# Patient Record
Sex: Female | Born: 1935 | Race: White | Hispanic: No | Smoking: Never smoker
Health system: Southern US, Community
[De-identification: ages and names within clinical notes are randomized; demographics above are authoritative.]

## PROBLEM LIST (undated history)

## (undated) DIAGNOSIS — I4891 Unspecified atrial fibrillation: Secondary | ICD-10-CM

## (undated) DIAGNOSIS — E039 Hypothyroidism, unspecified: Secondary | ICD-10-CM

## (undated) DIAGNOSIS — E785 Hyperlipidemia, unspecified: Secondary | ICD-10-CM

## (undated) DIAGNOSIS — I639 Cerebral infarction, unspecified: Secondary | ICD-10-CM

## (undated) DIAGNOSIS — I10 Essential (primary) hypertension: Secondary | ICD-10-CM

## (undated) DIAGNOSIS — F0280 Dementia in other diseases classified elsewhere without behavioral disturbance: Secondary | ICD-10-CM

## (undated) DIAGNOSIS — I69354 Hemiplegia and hemiparesis following cerebral infarction affecting left non-dominant side: Secondary | ICD-10-CM

## (undated) DIAGNOSIS — G300 Alzheimer's disease with early onset: Secondary | ICD-10-CM

## (undated) DIAGNOSIS — R54 Age-related physical debility: Secondary | ICD-10-CM

## (undated) DIAGNOSIS — E0590 Thyrotoxicosis, unspecified without thyrotoxic crisis or storm: Secondary | ICD-10-CM

## (undated) HISTORY — DX: Cerebral infarction, unspecified: I63.9

---

## 2015-10-16 ENCOUNTER — Emergency Department (HOSPITAL_COMMUNITY): Payer: Medicare Other

## 2015-10-16 ENCOUNTER — Inpatient Hospital Stay (HOSPITAL_COMMUNITY)
Admission: EM | Admit: 2015-10-16 | Discharge: 2015-10-20 | DRG: 065 | Disposition: A | Discharge status: Skilled Nursing Facility | Payer: Medicare Other | Attending: Internal Medicine | Admitting: Internal Medicine

## 2015-10-16 ENCOUNTER — Encounter (HOSPITAL_COMMUNITY): Payer: Self-pay

## 2015-10-16 DIAGNOSIS — I10 Essential (primary) hypertension: Secondary | ICD-10-CM | POA: Diagnosis present

## 2015-10-16 DIAGNOSIS — E0590 Thyrotoxicosis, unspecified without thyrotoxic crisis or storm: Secondary | ICD-10-CM | POA: Diagnosis present

## 2015-10-16 DIAGNOSIS — I639 Cerebral infarction, unspecified: Secondary | ICD-10-CM

## 2015-10-16 DIAGNOSIS — E785 Hyperlipidemia, unspecified: Secondary | ICD-10-CM | POA: Diagnosis present

## 2015-10-16 DIAGNOSIS — Z66 Do not resuscitate: Secondary | ICD-10-CM | POA: Diagnosis present

## 2015-10-16 DIAGNOSIS — R471 Dysarthria and anarthria: Secondary | ICD-10-CM | POA: Diagnosis present

## 2015-10-16 DIAGNOSIS — F0390 Unspecified dementia without behavioral disturbance: Secondary | ICD-10-CM | POA: Diagnosis present

## 2015-10-16 DIAGNOSIS — R29810 Facial weakness: Secondary | ICD-10-CM | POA: Diagnosis present

## 2015-10-16 DIAGNOSIS — I6789 Other cerebrovascular disease: Secondary | ICD-10-CM | POA: Diagnosis not present

## 2015-10-16 DIAGNOSIS — Z79899 Other long term (current) drug therapy: Secondary | ICD-10-CM | POA: Diagnosis not present

## 2015-10-16 DIAGNOSIS — I63511 Cerebral infarction due to unspecified occlusion or stenosis of right middle cerebral artery: Secondary | ICD-10-CM

## 2015-10-16 DIAGNOSIS — G8194 Hemiplegia, unspecified affecting left nondominant side: Secondary | ICD-10-CM | POA: Diagnosis present

## 2015-10-16 DIAGNOSIS — Z885 Allergy status to narcotic agent status: Secondary | ICD-10-CM | POA: Diagnosis not present

## 2015-10-16 DIAGNOSIS — I481 Persistent atrial fibrillation: Secondary | ICD-10-CM | POA: Diagnosis not present

## 2015-10-16 DIAGNOSIS — E039 Hypothyroidism, unspecified: Secondary | ICD-10-CM | POA: Diagnosis present

## 2015-10-16 DIAGNOSIS — I4891 Unspecified atrial fibrillation: Secondary | ICD-10-CM | POA: Diagnosis present

## 2015-10-16 DIAGNOSIS — Z888 Allergy status to other drugs, medicaments and biological substances status: Secondary | ICD-10-CM

## 2015-10-16 DIAGNOSIS — F0391 Unspecified dementia with behavioral disturbance: Secondary | ICD-10-CM | POA: Insufficient documentation

## 2015-10-16 DIAGNOSIS — I4819 Other persistent atrial fibrillation: Secondary | ICD-10-CM

## 2015-10-16 DIAGNOSIS — I63411 Cerebral infarction due to embolism of right middle cerebral artery: Principal | ICD-10-CM | POA: Diagnosis present

## 2015-10-16 DIAGNOSIS — F03918 Unspecified dementia, unspecified severity, with other behavioral disturbance: Secondary | ICD-10-CM | POA: Insufficient documentation

## 2015-10-16 DIAGNOSIS — R1310 Dysphagia, unspecified: Secondary | ICD-10-CM | POA: Diagnosis present

## 2015-10-16 HISTORY — DX: Thyrotoxicosis, unspecified without thyrotoxic crisis or storm: E05.90

## 2015-10-16 LAB — PROTIME-INR
INR: 1.14 (ref 0.00–1.49)
PROTHROMBIN TIME: 14.7 s (ref 11.6–15.2)

## 2015-10-16 LAB — I-STAT CHEM 8, ED
BUN: 27 mg/dL — ABNORMAL HIGH (ref 6–20)
CHLORIDE: 105 mmol/L (ref 101–111)
Calcium, Ion: 1.12 mmol/L — ABNORMAL LOW (ref 1.13–1.30)
Creatinine, Ser: 0.8 mg/dL (ref 0.44–1.00)
GLUCOSE: 122 mg/dL — AB (ref 65–99)
HEMATOCRIT: 45 % (ref 36.0–46.0)
HEMOGLOBIN: 15.3 g/dL — AB (ref 12.0–15.0)
POTASSIUM: 4.1 mmol/L (ref 3.5–5.1)
SODIUM: 140 mmol/L (ref 135–145)
TCO2: 23 mmol/L (ref 0–100)

## 2015-10-16 LAB — COMPREHENSIVE METABOLIC PANEL
ALT: 20 U/L (ref 14–54)
AST: 27 U/L (ref 15–41)
Albumin: 3.6 g/dL (ref 3.5–5.0)
Alkaline Phosphatase: 75 U/L (ref 38–126)
Anion gap: 11 (ref 5–15)
BUN: 25 mg/dL — ABNORMAL HIGH (ref 6–20)
CHLORIDE: 106 mmol/L (ref 101–111)
CO2: 23 mmol/L (ref 22–32)
Calcium: 9.6 mg/dL (ref 8.9–10.3)
Creatinine, Ser: 0.91 mg/dL (ref 0.44–1.00)
GFR, EST AFRICAN AMERICAN: 57 mL/min — AB (ref >60)
GFR, EST NON AFRICAN AMERICAN: 49 mL/min — AB (ref >60)
Glucose, Bld: 125 mg/dL — ABNORMAL HIGH (ref 65–99)
POTASSIUM: 4.3 mmol/L (ref 3.5–5.1)
SODIUM: 140 mmol/L (ref 135–145)
Total Bilirubin: 1.1 mg/dL (ref 0.3–1.2)
Total Protein: 6.4 g/dL — ABNORMAL LOW (ref 6.5–8.1)

## 2015-10-16 LAB — DIFFERENTIAL
BASOS ABS: 0 10*3/uL (ref 0.0–0.1)
BASOS PCT: 0 %
EOS ABS: 0.1 10*3/uL (ref 0.0–0.7)
Eosinophils Relative: 2 %
Lymphocytes Relative: 35 %
Lymphs Abs: 2.3 10*3/uL (ref 0.7–4.0)
MONO ABS: 0.4 10*3/uL (ref 0.1–1.0)
MONOS PCT: 5 %
Neutro Abs: 3.8 10*3/uL (ref 1.7–7.7)
Neutrophils Relative %: 58 %

## 2015-10-16 LAB — CBG MONITORING, ED: GLUCOSE-CAPILLARY: 104 mg/dL — AB (ref 65–99)

## 2015-10-16 LAB — URINE MICROSCOPIC-ADD ON

## 2015-10-16 LAB — CBC
HEMATOCRIT: 41 % (ref 36.0–46.0)
Hemoglobin: 13.8 g/dL (ref 12.0–15.0)
MCH: 31.4 pg (ref 26.0–34.0)
MCHC: 33.7 g/dL (ref 30.0–36.0)
MCV: 93.4 fL (ref 78.0–100.0)
PLATELETS: 161 10*3/uL (ref 150–400)
RBC: 4.39 MIL/uL (ref 3.87–5.11)
RDW: 14.4 % (ref 11.5–15.5)
WBC: 6.6 10*3/uL (ref 4.0–10.5)

## 2015-10-16 LAB — URINALYSIS, ROUTINE W REFLEX MICROSCOPIC
GLUCOSE, UA: NEGATIVE mg/dL
Ketones, ur: 15 mg/dL — AB
Leukocytes, UA: NEGATIVE
Nitrite: NEGATIVE
PH: 5 (ref 5.0–8.0)
Protein, ur: NEGATIVE mg/dL
SPECIFIC GRAVITY, URINE: 1.028 (ref 1.005–1.030)

## 2015-10-16 LAB — RAPID URINE DRUG SCREEN, HOSP PERFORMED
Amphetamines: NOT DETECTED
BENZODIAZEPINES: NOT DETECTED
Barbiturates: NOT DETECTED
COCAINE: NOT DETECTED
Opiates: NOT DETECTED
Tetrahydrocannabinol: NOT DETECTED

## 2015-10-16 LAB — APTT: APTT: 29 s (ref 24–37)

## 2015-10-16 LAB — ETHANOL

## 2015-10-16 LAB — I-STAT TROPONIN, ED: TROPONIN I, POC: 0.01 ng/mL (ref 0.00–0.08)

## 2015-10-16 NOTE — ED Provider Notes (Signed)
CSN: [id number]     Arrival date & time 10/16/15  1934 History   First MD Initiated Contact with Patient 10/16/15 2002     Chief Complaint  Patient presents with  . Code Stroke    @EDPCLEARED @ (Consider location/radiation/quality/duration/timing/severity/associated sxs/prior Treatment) HPI   Jordan Ayala is a 81 y.o. female who presents for evaluation of acute weakness and slurred speech. Nursing home attendants report that was last seen normal at 1840 today. Just prior to admission she was found altered and weak. She was transferred by EMS for evaluation as a code stroke patient.  Level V caveat-altered mental status    Past Medical History  Diagnosis Date  . Hyperthyroidism   . Hyperthyroidism    History reviewed. No pertinent past surgical history. No family history on file. Social History  Substance Use Topics  . Smoking status: Unknown If Ever Smoked  . Smokeless tobacco: None  . Alcohol Use: No   OB History    No data available     Review of Systems  Unable to perform ROS: Mental status change      Allergies  Codeine; Demerol; and Vioxx  Home Medications   Prior to Admission medications   Medication Sig Start Date End Date Taking? Authorizing Provider  acetaminophen (TYLENOL) 500 MG tablet Take 500 mg by mouth 2 (two) times daily.   Yes Historical Provider, MD  levothyroxine (SYNTHROID, LEVOTHROID) 25 MCG tablet Take 12.5 mcg by mouth daily before breakfast.   Yes Historical Provider, MD  levothyroxine (SYNTHROID, LEVOTHROID) 50 MCG tablet Take 50 mcg by mouth daily before breakfast.   Yes Historical Provider, MD   BP 186/71 mmHg  Pulse 48  Temp(Src) 97.6 F (36.4 C)  Resp 18  Ht 5\' 4"  (1.626 m)  Wt 140 lb (63.504 kg)  BMI 24.02 kg/m2  SpO2 97% Physical Exam  Constitutional: She appears well-developed.  Frail, elderly  HENT:  Head: Normocephalic and atraumatic.  Right Ear: External ear normal.  Left Ear: External ear normal.  Eyes:  Conjunctivae and EOM are normal. Pupils are equal, round, and reactive to light.  Neck: Normal range of motion and phonation normal. Neck supple.  Cardiovascular: Normal rate, regular rhythm and normal heart sounds.   Pulmonary/Chest: Effort normal and breath sounds normal. She exhibits no bony tenderness.  Abdominal: Soft. There is no tenderness.  Musculoskeletal: She exhibits edema (bilateral legs).  Neurological: She is alert. No cranial nerve deficit or sensory deficit. She exhibits normal muscle tone. Coordination normal.  She is dysarthric. Left-sided neglect is present. Flaccid paralysis, left arms and legs and left facial droop.  Skin: Skin is warm, dry and intact.  Psychiatric: She has a normal mood and affect. Her behavior is normal.  Nursing note and vitals reviewed.   ED Course  Procedures (including critical care time)  The patient was seen on arrival by the stroke neurologist, who decided to not give her TPA.   Medications - No data to display  Patient Vitals for the past 24 hrs:  BP Temp Pulse Resp SpO2 Height Weight  10/16/15 2022 - 97.6 F (36.4 C) - - - - -  10/16/15 1953 186/71 mmHg - - - - - -  10/16/15 1951 - - (!) 48 18 97 % 5\' 4"  (1.626 m) 140 lb (63.504 kg)    9:41 PM Reevaluation with update and discussion. After initial assessment and treatment, an updated evaluation reveals no change in clinical status. No family members have shown up. Apparently the  neurologist attempted to contact them earlier and they were not available. Breylen Agyeman L   Patient's niece and her husband arrived. They were updated on the findings. They called some other family members, and  had family meeting with her. Family members informed me that she was a DO NOT RESUSCITATE.   Labs Review Labs Reviewed  COMPREHENSIVE METABOLIC PANEL - Abnormal; Notable for the following:    Glucose, Bld 125 (*)    BUN 25 (*)    Total Protein 6.4 (*)    GFR calc non Af Amer 49 (*)    GFR calc  Af Amer 57 (*)    All other components within normal limits  URINALYSIS, ROUTINE W REFLEX MICROSCOPIC (NOT AT [facility]) - Abnormal; Notable for the following:    Color, Urine AMBER (*)    Hgb urine dipstick TRACE (*)    Bilirubin Urine SMALL (*)    Ketones, ur 15 (*)    All other components within normal limits  URINE MICROSCOPIC-ADD ON - Abnormal; Notable for the following:    Squamous Epithelial / LPF 0-5 (*)    Bacteria, UA RARE (*)    Casts HYALINE CASTS (*)    Crystals CA OXALATE CRYSTALS (*)    All other components within normal limits  I-STAT CHEM 8, ED - Abnormal; Notable for the following:    BUN 27 (*)    Glucose, Bld 122 (*)    Calcium, Ion 1.12 (*)    Hemoglobin 15.3 (*)    All other components within normal limits  CBG MONITORING, ED - Abnormal; Notable for the following:    Glucose-Capillary 104 (*)    All other components within normal limits  ETHANOL  PROTIME-INR  APTT  CBC  DIFFERENTIAL  URINE RAPID DRUG SCREEN, HOSP PERFORMED  I-STAT TROPOININ, ED    Imaging Review Ct Head Wo Contrast  10/16/2015  CLINICAL DATA:  81 year old female with code stroke. Left facial droop and left-sided weakness. EXAM: CT HEAD WITHOUT CONTRAST TECHNIQUE: Contiguous axial images were obtained from the base of the skull through the vertex without intravenous contrast. COMPARISON:  None. FINDINGS: The ventricles are dilated and the sulci are prominent compatible with age-related atrophy. Periventricular and deep white matter hypodensities represent chronic microvascular ischemic changes. There is no intracranial hemorrhage. No mass effect or midline shift identified. The visualized paranasal sinuses and mastoid air cells are well aerated. The calvarium is intact. IMPRESSION: No acute intracranial hemorrhage. Age-related atrophy and chronic microvascular ischemic disease. If symptoms persist and there are no contraindications, MRI may provide better evaluation if clinically indicated. These  results were called by telephone at the time of interpretation on 10/16/2015 at 7:53 pm to Dr. Hosie Poisson, who verbally acknowledged these results. Electronically Signed   By: Elgie Collard M.D.   On: 10/16/2015 19:55   I have personally reviewed and evaluated these images and lab results as part of my medical decision-making.   EKG Interpretation   Date/Time:  Friday October 16 2015 19:55:22 EST Ventricular Rate:  44 PR Interval:    QRS Duration: 81 QT Interval:  470 QTC Calculation: 402 R Axis:   62 Text Interpretation:  Atrial fibrillation Low voltage, precordial leads  Probable anteroseptal infarct, old Nonspecific T abnormalities, inferior  leads No old tracing to compare Confirmed by [facility]  MD, Delphina Schum [phone]) on  10/16/2015 9:51:21 PM      MDM   Final diagnoses:  Acute right MCA stroke (HCC)  Atrial fibrillation, unspecified type (HCC)  Nursing Notes Reviewed/ Care Coordinated, and agree without changes. Applicable Imaging Reviewed.  Interpretation of Laboratory Data incorporated into ED treatment  Plan: Admit    Mancel Bale, MD 10/16/15 2342

## 2015-10-16 NOTE — ED Notes (Signed)
[facility], pt LSN at 1640 walking around room and then sat down in recliner, pt was found by staff with L sided facial droop and and flaccid L side, and slurred speech. Disoriented X 3

## 2015-10-16 NOTE — Consult Note (Addendum)
Stroke Consult Consulting Physician: Dr Micah Flesher ED  Chief Complaint: altered mental status, left sided weakness  HPI: Jordan Ayala is an 81 y.o. female hx of baseline dementia, currently residing in a NH brought in after being found slumped over with difficulty talking and apparent left-sided weakness. Discussed with NH staff, LSW is unclear. Patient was seen standing by her table around 1800 but no one communicated with her at that time so it is unclear if she was already having speech/mental status changes at that time. Found around 1900 slumped over in her chair not moving her left-side.   CT head imaging reviewed, shows generalized atrophy, no acute process. Initial NIHSS of 23.  Date last known well: 10/16/2015 Time last known well: unclear  tPA Given: no, unclear LSW Modified Rankin: Rankin Score=3  Past Medical History  Diagnosis Date  . Hyperthyroidism   . Hyperthyroidism     History reviewed. No pertinent past surgical history.  No family history on file. Social History:  reports that she does not drink alcohol. Her tobacco and drug histories are not on file.  Allergies: No Known Allergies   (Not in a hospital admission)  ROS: Out of a complete 14 system review, the patient complains of only the following symptoms, and all other reviewed systems are negative. +weakness, confusion  Physical Examination: Filed Vitals:   10/16/15 1951 10/16/15 1953  BP:  186/71  Pulse: 48   Resp: 18    Physical Exam  Constitutional: He appears well-developed and well-nourished.  Psych: Affect appropriate to situation Eyes: No scleral injection HENT: No OP obstrucion Head: Normocephalic.  Cardiovascular: Normal rate and regular rhythm.  Respiratory: Effort normal and breath sounds normal.  GI: Soft. Bowel sounds are normal. No distension. There is no tenderness.  Skin: WDI  Neurologic Examination: Mental Status: Alert, oriented x 0. Limited verbal output, unable to  name items. Moderate dysarthria noted. Intermittently follows simple commands.  Cranial Nerves: II: unable to visualize optic discs, decreased blink to threat on the left, pupils equal, round, reactive to light III,IV, VI: ptosis not present, right gaze preference, cannot get eyes past midline CN: V, VII: left sided facial droop noted, unable to test facial sensation VIII: hearing normal bilaterally IX,X: gag reflex present XI: trapezius strength/neck flexion strength normal bilaterally XII: tongue strength normal  Motor: Unable to formally test due to mental status Lifts RUE and RLE against gravity and light resistance Unable to lift LUE or LLE against gravity Tone and bulk:normal tone throughout; no atrophy noted Sensory: withdrawals on right side, grimaces on left side Deep Tendon Reflexes: 2+ and symmetric throughout Plantars: Right: downgoing   Left: downgoing Cerebellar: Unable to test Gait: unable to test  Laboratory Studies:   Basic Metabolic Panel:  Recent Labs Lab 10/16/15 1944  NA 140  K 4.1  CL 105  GLUCOSE 122*  BUN 27*  CREATININE 0.80    Liver Function Tests: No results for input(s): AST, ALT, ALKPHOS, BILITOT, PROT, ALBUMIN in the last 168 hours. No results for input(s): LIPASE, AMYLASE in the last 168 hours. No results for input(s): AMMONIA in the last 168 hours.  CBC:  Recent Labs Lab 10/16/15 1939 10/16/15 1944  WBC 6.6  --   NEUTROABS 3.8  --   HGB 13.8 15.3*  HCT 41.0 45.0  MCV 93.4  --   PLT 161  --     Cardiac Enzymes: No results for input(s): CKTOTAL, CKMB, CKMBINDEX, TROPONINI in the last 168 hours.  BNP: Invalid  input(s): POCBNP  CBG: No results for input(s): GLUCAP in the last 168 hours.  Microbiology: No results found for this or any previous visit.  Coagulation Studies:  Recent Labs  10/16/15 1939  LABPROT 14.7  INR 1.14    Urinalysis: No results for input(s): COLORURINE, LABSPEC, PHURINE, GLUCOSEU, HGBUR,  BILIRUBINUR, KETONESUR, PROTEINUR, UROBILINOGEN, NITRITE, LEUKOCYTESUR in the last 168 hours.  Invalid input(s): APPERANCEUR  Lipid Panel:  No results found for: CHOL, TRIG, HDL, CHOLHDL, VLDL, LDLCALC  HgbA1C: No results found for: HGBA1C  Urine Drug Screen:  No results found for: LABOPIA, COCAINSCRNUR, LABBENZ, AMPHETMU, THCU, LABBARB  Alcohol Level: No results for input(s): ETH in the last 168 hours.  Other results: EKG: atrial fibrillation  Imaging: Ct Head Wo Contrast  10/16/2015  CLINICAL DATA:  81 year old female with code stroke. Left facial droop and left-sided weakness. EXAM: CT HEAD WITHOUT CONTRAST TECHNIQUE: Contiguous axial images were obtained from the base of the skull through the vertex without intravenous contrast. COMPARISON:  None. FINDINGS: The ventricles are dilated and the sulci are prominent compatible with age-related atrophy. Periventricular and deep white matter hypodensities represent chronic microvascular ischemic changes. There is no intracranial hemorrhage. No mass effect or midline shift identified. The visualized paranasal sinuses and mastoid air cells are well aerated. The calvarium is intact. IMPRESSION: No acute intracranial hemorrhage. Age-related atrophy and chronic microvascular ischemic disease. If symptoms persist and there are no contraindications, MRI may provide better evaluation if clinically indicated. These results were called by telephone at the time of interpretation on 10/16/2015 at 7:53 pm to Dr. Hosie PoissonSumner, who verbally acknowledged these results. Electronically Signed   By: Elgie CollardArash  Radparvar M.D.   On: 10/16/2015 19:55    Assessment: [age] y.o. female hx of dementia, currently residing in a NH presenting to ED with sudden onset speech deficits and left-sided weakness. Found to be in slow A fib in the ED. Based on exam findings, concern for right MCA infarct, likely embolic etiology. EMS reports LSW of 1845 but after further discussion with NH staff  I do not feel we have a definite LSW time. Based on this would not consider patient to be a tPA candidate. Due to baseline dementia and functional status not a IR candidate. Will be admitted for further workup. Called patients niece to discuss but no answer, message left.   Plan: 1. HgbA1c, fasting lipid panel 2. MRI, MRA  of the brain without contrast 3. PT consult, OT consult, Speech consult 4. Echocardiogram 5. Carotid dopplers 6. Prophylactic therapy-ASA 325mg  daily 7. Risk factor modification 8. Telemetry monitoring 9. Frequent neuro checks 10. NPO until RN stroke swallow screen   Elspeth Choeter Slade [place], DO [facility] [phone]  If 7pm- 7am, please page neurology on call as listed in AMION. 10/16/2015, 8:01 PM

## 2015-10-16 NOTE — Progress Notes (Signed)
Code Stroke called on [age] y.o female, per EMS she was last seen normal at 1840. Pt resides in nursing home, per nursing home staff (called) she was seen standing at her table at 1800, however no one communicated with her at that time. Patient was then found around 1900 slumped over with left side weakness, facial droop, and difficulty speaking.  Pt with history of hyperthyroidism (on synthroid) and currently in A fib on monitor. No further history found in chart. Pt taken STAT to CT scan, negative for acute bleed per Neurologist Dr. Hosie PoissonSumner. NIHSS completed upon arrival to ED room yielding score of 23, see NIHSS flow sheet for specifics. CBG 104. Pt not a TPA candidate due to LSN being unclear. For admit to hospital for stroke work up.

## 2015-10-17 ENCOUNTER — Inpatient Hospital Stay (HOSPITAL_COMMUNITY): Payer: Medicare Other

## 2015-10-17 DIAGNOSIS — I4891 Unspecified atrial fibrillation: Secondary | ICD-10-CM

## 2015-10-17 DIAGNOSIS — I63511 Cerebral infarction due to unspecified occlusion or stenosis of right middle cerebral artery: Secondary | ICD-10-CM

## 2015-10-17 DIAGNOSIS — I6789 Other cerebrovascular disease: Secondary | ICD-10-CM

## 2015-10-17 DIAGNOSIS — E039 Hypothyroidism, unspecified: Secondary | ICD-10-CM

## 2015-10-17 LAB — LIPID PANEL
CHOL/HDL RATIO: 2.8 ratio
CHOLESTEROL: 173 mg/dL (ref 0–200)
HDL: 61 mg/dL (ref >40)
LDL CALC: 102 mg/dL — AB (ref 0–99)
Triglycerides: 52 mg/dL (ref <150)
VLDL: 10 mg/dL (ref 0–40)

## 2015-10-17 LAB — T4, FREE: Free T4: 1.06 ng/dL (ref 0.61–1.12)

## 2015-10-17 LAB — TSH: TSH: 4.083 u[IU]/mL (ref 0.350–4.500)

## 2015-10-17 LAB — MRSA PCR SCREENING: MRSA BY PCR: POSITIVE — AB

## 2015-10-17 MED ORDER — ASPIRIN 325 MG PO TABS
325.0000 mg | ORAL_TABLET | Freq: Every day | ORAL | Status: DC
  Administered 2015-10-17 – 2015-10-19 (×3): 325 mg via ORAL
  Filled 2015-10-17 (×3): qty 1

## 2015-10-17 MED ORDER — ASPIRIN 300 MG RE SUPP: 300.0000 mg | Freq: Every day | RECTAL | Status: DC

## 2015-10-17 MED ORDER — HEPARIN SODIUM (PORCINE) 5000 UNIT/ML IJ SOLN
5000.0000 [IU] | Freq: Three times a day (TID) | INTRAMUSCULAR | Status: AC
  Administered 2015-10-17 – 2015-10-19 (×8): 5000 [IU] via SUBCUTANEOUS
  Filled 2015-10-17 (×9): qty 1

## 2015-10-17 MED ORDER — STROKE: EARLY STAGES OF RECOVERY BOOK
Freq: Once | Status: AC
  Administered 2015-10-17: 1
  Filled 2015-10-17: qty 1

## 2015-10-17 MED ORDER — ATORVASTATIN CALCIUM 10 MG PO TABS
20.0000 mg | ORAL_TABLET | Freq: Every day | ORAL | Status: DC
  Administered 2015-10-17 – 2015-10-19 (×3): 20 mg via ORAL
  Filled 2015-10-17 (×3): qty 2

## 2015-10-17 MED ORDER — SENNOSIDES-DOCUSATE SODIUM 8.6-50 MG PO TABS: 1.0000 | ORAL_TABLET | Freq: Every evening | ORAL | Status: DC | PRN

## 2015-10-17 MED ORDER — LEVOTHYROXINE SODIUM 25 MCG PO TABS: 12.5000 ug | ORAL_TABLET | Freq: Every day | ORAL | Status: DC

## 2015-10-17 MED ORDER — SODIUM CHLORIDE 0.9 % IV SOLN
INTRAVENOUS | Status: DC
  Administered 2015-10-17: 01:00:00 via INTRAVENOUS

## 2015-10-17 MED ORDER — CHLORHEXIDINE GLUCONATE 0.12 % MT SOLN
15.0000 mL | Freq: Two times a day (BID) | OROMUCOSAL | Status: DC
  Administered 2015-10-17 – 2015-10-20 (×6): 15 mL via OROMUCOSAL
  Filled 2015-10-17 (×6): qty 15

## 2015-10-17 MED ORDER — LEVOTHYROXINE SODIUM 50 MCG PO TABS: 50.0000 ug | ORAL_TABLET | Freq: Every day | ORAL | Status: DC

## 2015-10-17 MED ORDER — RESOURCE THICKENUP CLEAR PO POWD
ORAL | Status: DC | PRN
  Filled 2015-10-17: qty 125

## 2015-10-17 MED ORDER — ACETAMINOPHEN 500 MG PO TABS
500.0000 mg | ORAL_TABLET | Freq: Two times a day (BID) | ORAL | Status: DC
  Administered 2015-10-17 – 2015-10-20 (×7): 500 mg via ORAL
  Filled 2015-10-17 (×7): qty 1

## 2015-10-17 MED ORDER — CETYLPYRIDINIUM CHLORIDE 0.05 % MT LIQD
7.0000 mL | Freq: Two times a day (BID) | OROMUCOSAL | Status: DC
  Administered 2015-10-18 – 2015-10-20 (×5): 7 mL via OROMUCOSAL

## 2015-10-17 MED ORDER — LEVOTHYROXINE SODIUM 50 MCG PO TABS
62.5000 ug | ORAL_TABLET | Freq: Every day | ORAL | Status: DC
  Administered 2015-10-18 – 2015-10-20 (×3): 62.5 ug via ORAL
  Filled 2015-10-17 (×3): qty 1

## 2015-10-17 NOTE — Evaluation (Signed)
Physical Therapy Evaluation Patient Details Name: Jordan Ayala MRN: [id number] DOB: 07/04/1911 Today's Date: 10/17/2015   History of Present Illness  Jordan Ayala is a78 year old female with past medical history of hypothyroidism and dementia; who presents from [facility] with left-sided weakness. MRI: Acute infarct right insula and parietal operculum  Clinical Impression  Pt is s/p stroke. Presents to PT with significant dependencies in mobility. Do not feel she could safely return to ALF unless she was able to make large improvements. Will benefit from PT in the acute setting to maximize functional mobility.      Follow Up Recommendations SNF;Supervision/Assistance - 24 hour    Equipment Recommendations  None recommended by PT    Recommendations for Other Services       Precautions / Restrictions Precautions Precautions: Fall Precaution Comments: left side inattention Restrictions Weight Bearing Restrictions: No      Mobility  Bed Mobility Overal bed mobility: Needs Assistance Bed Mobility: Supine to Sit     Supine to sit: Min assist     General bed mobility comments: Increased time and cues to tay on task  Transfers Overall transfer level: Needs assistance Equipment used: 1 person hand held assist Transfers: Sit to/from [employer] Transfers Sit to Stand: Mod assist Stand pivot transfers: Mod assist;+2 safety/equipment       General transfer comment: Attempted to ambuate but pt she was iunable therefore transferred bed to chair  Ambulation/Gait Ambulation/Gait assistance:  (Unable)              Stairs            Wheelchair Mobility    Modified Rankin (Stroke Patients Only)       Balance Overall balance assessment: Needs assistance Sitting-balance support: No upper extremity supported;Feet supported Sitting balance-Leahy Scale: Fair     Standing balance support: Bilateral upper extremity supported Standing  balance-Leahy Scale: Poor                               Pertinent Vitals/Pain Pain Assessment: Faces Faces Pain Scale: No hurt Pain Location: head ache Pain Descriptors / Indicators: Aching Pain Intervention(s): Monitored during session;Patient requesting pain meds-RN notified    Home Living Family/patient expects to be discharged to:: Skilled nursing facility     Type of Home: Assisted living                Prior Function Level of Independence: Needs assistance   Gait / Transfers Assistance Needed: Used RW and cane at ALF  ADL's / Homemaking Assistance Needed: staff were suppose to A her basic ADLs, but family states that she would not let them        Hand Dominance   Dominant Hand: Right    Extremity/Trunk Assessment   Upper Extremity Assessment: Defer to OT evaluation       LUE Deficits / Details: Brunstrum 2 with decreased attention to left side. Intermittently attempts to use it for Bil UE tasks (putting on and taking off glasses, but unable to hold onto them and then does not try to re grasp them)   Lower Extremity Assessment: LLE deficits/detail   LLE Deficits / Details: Overall appears to be weaker than RLE but difficult to MMT due to pt inconsistently following commands  Cervical / Trunk Assessment: Kyphotic  Communication   Communication: HOH  Cognition Arousal/Alertness: Awake/alert Behavior During Therapy: WFL for tasks assessed/performed Overall Cognitive Status: Impaired/Different from baseline  Area of Impairment: Attention;Following commands;Safety/judgement;Problem solving;Orientation;Memory Orientation Level: Place;Time;Situation;Disoriented to Current Attention Level: Sustained   Following Commands: Follows one step commands inconsistently Safety/Judgement: Decreased awareness of safety;Decreased awareness of deficits   Problem Solving: Slow processing;Decreased initiation;Difficulty sequencing;Requires verbal cues;Requires  tactile cues General Comments: easily distracted    General Comments General comments (skin integrity, edema, etc.): Niece Jordan DandyMary present for entire eval.  Pt very pleasant and willing to work with PT.    Exercises        Assessment/Plan    PT Assessment Patient needs continued PT services  PT Diagnosis Difficulty walking   PT Problem List Decreased strength;Decreased mobility;Decreased balance;Decreased coordination;Decreased knowledge of use of DME;Decreased safety awareness  PT Treatment Interventions DME instruction;Gait training;Functional mobility training;Therapeutic activities;Balance training;Neuromuscular re-education;Patient/family education   PT Goals (Current goals can be found in the Care Plan section) Acute Rehab PT Goals Patient Stated Goal: Family did not state goal PT Goal Formulation: Patient unable to participate in goal setting Time For Goal Achievement: 10/31/15 Potential to Achieve Goals: Fair    Frequency Min 3X/week   Barriers to discharge        Co-evaluation               End of Session Equipment Utilized During Treatment: Gait belt Activity Tolerance: Patient tolerated treatment well Patient left: in chair;with call bell/phone within reach;with chair alarm set;with family/visitor present;Other (comment) (MD entered as PT left) Nurse Communication: Other (comment) (Pt up in chair with niece present)         Time: [id number] PT Time Calculation (min) (ACUTE ONLY): 28 min   Charges:   PT Evaluation $Initial PT Evaluation Tier I: 1 Procedure PT Treatments $Gait Training: 8-22 mins   PT G CodesDonnella Ayala:        Jordan Ayala 10/17/2015, 1:42 PM  Jordan MoundMark Stepfanie Ayala, PT  [phone] 10/17/2015

## 2015-10-17 NOTE — Evaluation (Signed)
Speech Language Pathology Evaluation Patient Details Name: Serenity Batley MRN: [id number] DOB: 11-22-1910 Today's Date: 10/17/2015 Time: 1030-1050 SLP Time Calculation (min) (ACUTE ONLY): 20 min  Problem List:  Patient Active Problem List   Diagnosis Date Noted  . Hypothyroidism 10/17/2015  . Atrial fibrillation (HCC) 10/17/2015  . Acute right MCA stroke (HCC) 10/16/2015   Past Medical History:  Past Medical History  Diagnosis Date  . Hyperthyroidism   . Hyperthyroidism    Past Surgical History: History reviewed. No pertinent past surgical history. HPI:  Ms. Mcmahan is a54 year old female with past medical history of hypothyroidism and dementia; who presents from [facility] with left-sided weakness. Patient was last seen normal between the times of 1641 - 1800 walking around her room. Then there is report that around 1900 patient wasfound slumped over in a chair not moving her left side. Patient noted to have slurred speech with a left facial droop and unable to move left side. MRI shows Acute infarct right insula and parietal operculum. No further history in chart.    Assessment / Plan / Recommendation Clinical Impression  Pt demonstrates moderate dysarthria with imprecise articulation due to CN VII weakness. Pt is approximately 75% intelligible at conversation level despite impairment. In addition pt has new cognitive impairment secondary to Right hemisphere CVA with left inattention and poor awareness of physical deficits. This combined with dementia and poor memory at baseline make pt a significant safety risk. Recommend SLP f/u for speech intelligibility/cognitive strategies with pt/family. Recommend SNF at d/c.     SLP Assessment  Patient needs continued Speech Lanaguage Pathology Services    Follow Up Recommendations  Skilled Nursing facility    Frequency and Duration min 2x/week  2 weeks      SLP Evaluation Prior Functioning  Cognitive/Linguistic  Baseline: Baseline deficits Baseline deficit details: dementia Type of Home: Assisted living   Cognition  Overall Cognitive Status: Impaired/Different from baseline Arousal/Alertness: Awake/alert Orientation Level: Oriented to person;Disoriented to place;Disoriented to time;Disoriented to situation Attention: Focused;Sustained Focused Attention: Appears intact Sustained Attention: Impaired Sustained Attention Impairment: Verbal basic;Functional basic Memory: Impaired Memory Impairment: Storage deficit;Retrieval deficit;Decreased recall of new information;Decreased short term memory Decreased Short Term Memory: Verbal basic;Functional basic Awareness: Impaired Awareness Impairment: Intellectual impairment;Emergent impairment;Anticipatory impairment Problem Solving: Impaired Problem Solving Impairment: Verbal complex;Functional complex Safety/Judgment: Impaired    Comprehension  Auditory Comprehension Overall Auditory Comprehension: Impaired at baseline Yes/No Questions: Within Functional Limits Commands: Impaired One Step Basic Commands: 75-100% accurate Two Step Basic Commands: 0-24% accurate Conversation: Simple Interfering Components: Processing speed;Working [employer] fast streamer;Attention EffectiveTechniques: Extra processing time;Repetition;Increased volume Visual Recognition/Discrimination Discrimination: Within Function Limits Reading Comprehension Reading Status: Not tested    Expression Expression Primary Mode of Expression: Verbal Verbal Expression Overall Verbal Expression: Appears within functional limits for tasks assessed Written Expression Dominant Hand: Right   Oral / Motor Oral Motor/Sensory Function Overall Oral Motor/Sensory Function: Moderate impairment Facial ROM: Reduced left;Suspected CN VII (facial) dysfunction Facial Symmetry: Abnormal symmetry left;Suspected CN VII (facial) dysfunction Facial Strength: Reduced left;Suspected CN VII (facial) dysfunction Facial  Sensation: Reduced left;Suspected CN V (Trigeminal) dysfunction Lingual ROM: Reduced left;Suspected CN XII (hypoglossal) dysfunction Lingual Symmetry: Within Functional Limits Lingual Strength: Within Functional Limits Lingual Sensation: Within Functional Limits Velum: Within Functional Limits Mandible: Within Functional Limits Motor Speech Overall Motor Speech: Impaired Respiration: Within functional limits Phonation: Normal Resonance: Within functional limits Articulation: Impaired Level of Impairment: Word Intelligibility: Intelligibility reduced Word: 50-74% accurate Phrase: 50-74% accurate Sentence: 50-74% accurate Conversation: 50-74% accurate Motor Planning:  Witnin functional limits Motor Speech Errors: Unaware Effective Techniques: Slow rate;Increased vocal intensity   Harlon DittyBonnie Darrian Goodwill, [place]MA CCC-SLP [phone]  Claudine MoutonDeBlois, Alandis Bluemel Caroline 10/17/2015, 11:13 AM

## 2015-10-17 NOTE — Progress Notes (Signed)
PT Cancellation Note  Patient Details Name: Jordan Ayala MRN: [id number] DOB: Aug 11, 1911   Cancelled Treatment:    Reason Eval/Treat Not Completed: Other (comment) (PT currently has bedrest orders) Please increase activity level when appropriate.  Donnella ShamSawulski, Milon Dethloff J 10/17/2015, 8:32 AM Jordan Ayala, PT  [phone] 10/17/2015

## 2015-10-17 NOTE — Progress Notes (Signed)
STROKE TEAM PROGRESS NOTE   HISTORY  Jordan Ayala is an [age] y.o. female hx of baseline dementia, currently residing in a NH brought in after being found slumped over with difficulty talking and apparent left-sided weakness. Discussed with NH staff, LSW is unclear. Patient was seen standing by her table around 1800 but no one communicated with her at that time so it is unclear if she was already having speech/mental status changes at that time. Found around 1900 slumped over in her chair not moving her left-side.   CT head imaging reviewed, shows generalized atrophy, no acute process. Initial NIHSS of 23.   Date last known well: 10/16/2015 Time last known well: unclear tPA Given: no, unclear LSW Modified Rankin: Rankin Score=3    SUBJECTIVE (INTERVAL HISTORY) Feels better. Daughter at bedside. She had a headche, resolved. Was not on anti-platelets previous to admission.   OBJECTIVE Temp:  [96.8 F (36 C)-97.9 F (36.6 C)] 97.7 F (36.5 C) (12/24 1400) Pulse Rate:  [40-73] 40 (12/24 1431) Cardiac Rhythm:  [-] Atrial fibrillation (12/24 0833) Resp:  [16-20] 16 (12/24 1400) BP: (150-195)/(58-148) 155/127 mmHg (12/24 1431) SpO2:  [95 %-98 %] 97 % (12/24 0936) Weight:  [63.504 kg (140 lb)] 63.504 kg (140 lb) (12/23 1951)  CBC:   Recent Labs Lab 10/16/15 1939 10/16/15 1944  WBC 6.6  --   NEUTROABS 3.8  --   HGB 13.8 15.3*  HCT 41.0 45.0  MCV 93.4  --   PLT 161  --     Basic Metabolic Panel:   Recent Labs Lab 10/16/15 1939 10/16/15 1944  NA 140 140  K 4.3 4.1  CL 106 105  CO2 23  --   GLUCOSE 125* 122*  BUN 25* 27*  CREATININE 0.91 0.80  CALCIUM 9.6  --     Lipid Panel:     Component Value Date/Time   CHOL 173 10/17/2015 0538   TRIG 52 10/17/2015 0538   HDL 61 10/17/2015 0538   CHOLHDL 2.8 10/17/2015 0538   VLDL 10 10/17/2015 0538   LDLCALC 102* 10/17/2015 0538   HgbA1c: No results found for: HGBA1C Urine Drug Screen:     Component Value  Date/Time   LABOPIA NONE DETECTED 10/16/2015 2027   COCAINSCRNUR NONE DETECTED 10/16/2015 2027   LABBENZ NONE DETECTED 10/16/2015 2027   AMPHETMU NONE DETECTED 10/16/2015 2027   THCU NONE DETECTED 10/16/2015 2027   LABBARB NONE DETECTED 10/16/2015 2027      IMAGING  Dg Chest 2 View 10/17/2015   Cardiomegaly without acute cardiopulmonary findings. Cannot exclude pericardial effusion.    Ct Head Wo Contrast 10/16/2015    No acute intracranial hemorrhage. Age-related atrophy and chronic microvascular ischemic disease. If symptoms persist and there are no contraindications, MRI may provide better evaluation if clinically indicated.    Mr Jordan GlennMra Head/brain Wo Cm 10/17/2015   Acute infarct right insula and parietal operculum Advanced atrophy. Chronic microvascular ischemic change in the white matter MRA degraded by motion. Decreased signal middle cerebral arteries bilaterally right greater than left consistent with atherosclerotic disease.      PHYSICAL EXAM Physical Exam  Constitutional: He appears well-developed and well-nourished.  Psych: Affect appropriate to situation Eyes: No scleral injection HENT: No OP obstrucion Head: Normocephalic.  Cardiovascular: Normal rate and regular rhythm.  Respiratory: Effort normal and breath sounds normal.  GI: Soft. Bowel sounds are normal. No distension. There is no tenderness.  Skin: WDI  Neurologic Examination: Mental Status: Alert, oriented to person only. Names  glasses, gloves, blanket.  Moderate dysarthria noted but understandable. Intermittently follows simple 2-step commands(close eyes and clap hands), conversant Cranial Nerves: II: unable to visualize optic discs, can count fingers in every quadrant, pupils equal, round, reactive to light III,IV, VI: ptosis not present, EOMI CN: V, VII: left sided facial droop noted, denies left sensoty loss extinguishes on the left  VIII: hearing impaired bilaterally IX,X: gag reflex  present XI: trapezius strength/neck flexion strength normal bilaterally XII: tongue strength normal  Motor: Antigravity left arm Some antigravity left leg Tone and bulk:normal tone throughout; no atrophy noted Sensory: Left sided hemisensory loss, extinguishes Deep Tendon Reflexes: 2+ and symmetric throughout Plantars: Right: downgoingLeft: downgoing Cerebellar: Unable to test Gait: needs assistance to ambulate with walker. Stooped posture, shuffling gait.      ASSESSMENT/PLAN Ms. Jordan Ayala is a 81 y.o. female with history of hypothyroidism and dementia presenting with speech difficulties and left-sided weakness. Afib noted in ED.  She did not receive IV t-PA due to unknown time of onset.  Stroke:  Dominant infarct secondary to small vessel disease.  Resultant  Left-sided hemiparesis and hemisensory loss  MRI - Acute infarct right insula and parietal operculum  MRA - motion degraded  Carotid Doppler - pending  2D Echo - EF 55-60%. No cardiac source of emboli identified.  LDL - 102  HgbA1c pending  VTE prophylaxis - subcutaneous heparin DIET - DYS 1 Room service appropriate?: Yes; Fluid consistency:: Honey Thick  No antithrombotic prior to admission, now on aspirin 325 mg daily  Patient counseled to be compliant with her antithrombotic medications  Ongoing aggressive stroke risk factor management  Therapy recommendations: SNF recommended  Disposition: Pending  Hypertension  Blood pressure somewhat high  Permissive hypertension (OK if < 220/120) but gradually normalize in 5-7 days  Hyperlipidemia  Home meds:  No lipid lowering medications prior to admission  LDL 102, goal < 70  Now on Lipitor 20 mg daily  Continue statin at discharge  Atrial fibrillation  ER ECG - Afib VR 44 BPM  Bradycardia - TSH and T4 pending - (Not on rate slowing medications)   CHA2DS2-VASc Score = 6 - anticoagulation recommended   Age in Years:>75 = +2 Sex: Female +1 Hypertension History: yes = +1  Diabetes Mellitus: no + 0 Congestive Heart Failure History: no = 0  Vascular Disease History: no = 0 Stroke/TIA/Thromboembolism History: yes= +2  Question if patient is an appropriate candidate for anticoagulation ? Need discussions with family on whether they would like to continue aspirin or NOAC such as Eliquis. My recommendation would be aspirin given age and medical condition.     Other Stroke Risk Factors  Advanced age  Afib  Other Active Problems  Elevated BUN  Hospital day # 1  Personally examined patient and images, and have participated in and made any corrections needed to history, physical, neuro exam,assessment and plan as stated above.  I have personally obtained the history, evaluated lab date, reviewed imaging studies and agree with radiology interpretations.    Naomie Dean, MD Stroke Neurology [phone] [facility]       To contact Stroke Continuity provider, please refer to [email or web address]. After hours, contact General Neurology

## 2015-10-17 NOTE — Progress Notes (Addendum)
Patient Demographics  Jordan Ayala, is a 81 y.o. female, DOB - 05-11-1911, [id number]  Admit date - 10/16/2015   Admitting Physician Clydie Braunondell A Smith, MD  Outpatient Primary MD for the patient is No primary care provider on file.  LOS - 1   Chief Complaint  Patient presents with  . Code Stroke         Subjective:   Jordan SettingBernice Brooke today has, No headache, No chest pain, No abdominal pain - No Nausea,  No Cough - SOB.   Assessment & Plan    Principal Problem:   Acute right MCA stroke (HCC) Active Problems:   Hypothyroidism   Atrial fibrillation (HCC)  Acute right MCA stroke  - With residual left-sided weakness, right heel umbilicus etiology given she found to be in A. Fib. - MRI brain : Acute infarct right insula and parietal operculum - MRA head: Atherosclerosis and middle cerebral arteries right> left - Corrected Doppler: Pending - 2-D echo: Pending - LDL 102, started on simvastatin - Hemoglobin A1c pending - Currently on full dose aspirin for antithrombotic.  Hypothyroidism - Continue with levothyroxine  New onset A. Fib - new  diagnoses, Chads2Vasc 2 score of 5, will likely need anticoagulation. - Rate controlled    Code Status: DO NOT RESUSCITATE, confirmed with her niece who is her healthcare power of attorney  Family Communication: Spoke to multiple family members at bedside  Disposition Plan: Will need SNF placement when medically clear   Procedures  None   Consults   Neurology   Medications  Scheduled Meds: . acetaminophen  500 mg Oral BID  . aspirin  300 mg Rectal Daily   Or  . aspirin  325 mg Oral Daily  . heparin  5,000 Units Subcutaneous 3 times per day  . levothyroxine  62.5 mcg Oral QAC breakfast   Continuous Infusions: . sodium chloride 50 mL/hr at 10/17/15 0039   PRN Meds:.RESOURCE THICKENUP CLEAR, senna-docusate  DVT  Prophylaxis   Heparin  Lab Results  Component Value Date   PLT 161 10/16/2015    Antibiotics    Anti-infectives    None          Objective:   Filed Vitals:   10/17/15 0500 10/17/15 0700 10/17/15 0800 10/17/15 0936  BP: 186/67  150/76 189/63  Pulse: 44  47 55  Temp: 96.8 F (36 C)  97.9 F (36.6 C) 97.7 F (36.5 C)  TempSrc: Axillary Other (Comment) Axillary Oral  Resp: 18  16 16   Height:      Weight:      SpO2: 95%  97% 97%    Wt Readings from Last 3 Encounters:  10/16/15 63.504 kg (140 lb)    No intake or output data in the 24 hours ending 10/17/15 1113   Physical Exam  Awake Alert, Supple Neck,No JVD, Symmetrical Chest wall movement, Good air movement bilaterally, CTAB No Gallops,Rubs or new Murmurs, No Parasternal Heave +ve B.Sounds, Abd Soft, No tenderness, No organomegaly appriciated, No rebound - guarding or rigidity. No Cyanosis, Clubbing or edema, left-sided weakness+.   Data Review   Micro Results Recent Results (from the past 240 hour(s))  MRSA PCR Screening     Status: Abnormal   Collection Time: 10/17/15  3:34 AM  Result Value Ref Range Status   MRSA by PCR POSITIVE (A) NEGATIVE Final    Comment:        The GeneXpert MRSA Assay (FDA approved for NASAL specimens only), is one component of a comprehensive MRSA colonization surveillance program. It is not intended to diagnose MRSA infection nor to guide or monitor treatment for MRSA infections. RESULT CALLED TO, READ BACK BY AND VERIFIED WITH: RICH @0637  10/17/15 [facility]     Radiology Reports Dg Eye Foreign Body  10/17/2015  CLINICAL DATA:  Metal working/exposure; clearance prior to MRI EXAM: ORBITS FOR FOREIGN BODY - 2 VIEW COMPARISON:  None. FINDINGS: There is no evidence of metallic foreign body within the orbits. No significant bone abnormality identified. IMPRESSION: No evidence of metallic foreign body within the orbits. Electronically Signed   By: Sherian Rein M.D.   On:  10/17/2015 09:26   Dg Chest 2 View  10/17/2015  CLINICAL DATA:  Stroke EXAM: CHEST  2 VIEW COMPARISON:  None. FINDINGS: Stable enlarged cardiac silhouette. No effusion, infiltrate, pneumothorax. No acute osseous abnormality. Calcified hilar lymph node. IMPRESSION: Cardiomegaly without acute cardiopulmonary findings. Cannot exclude pericardial effusion. Electronically Signed   By: Genevive Bi M.D.   On: 10/17/2015 08:45   Dg Abd 1 View  10/17/2015  CLINICAL DATA:  Stroke EXAM: ABDOMEN - 1 VIEW COMPARISON:  None FINDINGS: There is a moderate stool burden identified throughout the colon. No dilated small bowel loops. No abnormal bowel dilatation. IMPRESSION: Moderate stool burden in the colon.  No obstruction. Electronically Signed   By: Signa Kell M.D.   On: 10/17/2015 09:38   Ct Head Wo Contrast  10/16/2015  CLINICAL DATA:  81 year old female with code stroke. Left facial droop and left-sided weakness. EXAM: CT HEAD WITHOUT CONTRAST TECHNIQUE: Contiguous axial images were obtained from the base of the skull through the vertex without intravenous contrast. COMPARISON:  None. FINDINGS: The ventricles are dilated and the sulci are prominent compatible with age-related atrophy. Periventricular and deep white matter hypodensities represent chronic microvascular ischemic changes. There is no intracranial hemorrhage. No mass effect or midline shift identified. The visualized paranasal sinuses and mastoid air cells are well aerated. The calvarium is intact. IMPRESSION: No acute intracranial hemorrhage. Age-related atrophy and chronic microvascular ischemic disease. If symptoms persist and there are no contraindications, MRI may provide better evaluation if clinically indicated. These results were called by telephone at the time of interpretation on 10/16/2015 at 7:53 pm to Dr. Hosie Poisson, who verbally acknowledged these results. Electronically Signed   By: Elgie Collard M.D.   On: 10/16/2015 19:55   Mr  Brain Wo Contrast  10/17/2015  CLINICAL DATA:  Left-sided weakness.  Stroke. EXAM: MRI HEAD WITHOUT CONTRAST MRA HEAD WITHOUT CONTRAST TECHNIQUE: Multiplanar, multiecho pulse sequences of the brain and surrounding structures were obtained without intravenous contrast. Angiographic images of the head were obtained using MRA technique without contrast. COMPARISON:  CT head 10/16/2015 FINDINGS: MRI HEAD FINDINGS Acute infarct involving the right posterior insula and parietal operculum. No other areas of acute infarct. Advanced atrophy with prominent ventricles and subarachnoid space diffusely. Chronic microvascular ischemic change in the white matter. Mild chronic ischemia in the pons. Negative for hemorrhage or fluid collection Negative for mass or edema.  No shift of the midline structures Normal pituitary.  Paranasal sinuses clear. MRA HEAD FINDINGS Image quality degraded by motion. Small right vertebral artery is hypoplastic and possibly disease. Small contribution to the basilar. Distal left vertebral artery patent  to the basilar. Basilar is irregular compatible with mild atherosclerotic disease. Fetal origin right posterior cerebral artery with hypoplastic right P1 segment. Both posterior cerebral arteries are patent. Internal carotid artery patent bilaterally. Anterior cerebral arteries patent bilaterally. M1 segment patent bilaterally. Decreased signal in the middle cerebral artery branches bilaterally right greater than left. Probable moderate stenosis of the right MCA bifurcation. Bilateral MCA atherosclerotic disease No aneurysm identified IMPRESSION: Acute infarct right insula and parietal operculum Advanced atrophy. Chronic microvascular ischemic change in the white matter MRA degraded by motion. Decreased signal middle cerebral arteries bilaterally right greater than left consistent with atherosclerotic disease. Electronically Signed   By: Marlan Palau M.D.   On: 10/17/2015 08:21   Mr Maxine Glenn Head/brain  Wo Cm  10/17/2015  CLINICAL DATA:  Left-sided weakness.  Stroke. EXAM: MRI HEAD WITHOUT CONTRAST MRA HEAD WITHOUT CONTRAST TECHNIQUE: Multiplanar, multiecho pulse sequences of the brain and surrounding structures were obtained without intravenous contrast. Angiographic images of the head were obtained using MRA technique without contrast. COMPARISON:  CT head 10/16/2015 FINDINGS: MRI HEAD FINDINGS Acute infarct involving the right posterior insula and parietal operculum. No other areas of acute infarct. Advanced atrophy with prominent ventricles and subarachnoid space diffusely. Chronic microvascular ischemic change in the white matter. Mild chronic ischemia in the pons. Negative for hemorrhage or fluid collection Negative for mass or edema.  No shift of the midline structures Normal pituitary.  Paranasal sinuses clear. MRA HEAD FINDINGS Image quality degraded by motion. Small right vertebral artery is hypoplastic and possibly disease. Small contribution to the basilar. Distal left vertebral artery patent to the basilar. Basilar is irregular compatible with mild atherosclerotic disease. Fetal origin right posterior cerebral artery with hypoplastic right P1 segment. Both posterior cerebral arteries are patent. Internal carotid artery patent bilaterally. Anterior cerebral arteries patent bilaterally. M1 segment patent bilaterally. Decreased signal in the middle cerebral artery branches bilaterally right greater than left. Probable moderate stenosis of the right MCA bifurcation. Bilateral MCA atherosclerotic disease No aneurysm identified IMPRESSION: Acute infarct right insula and parietal operculum Advanced atrophy. Chronic microvascular ischemic change in the white matter MRA degraded by motion. Decreased signal middle cerebral arteries bilaterally right greater than left consistent with atherosclerotic disease. Electronically Signed   By: Marlan Palau M.D.   On: 10/17/2015 08:21     CBC  Recent Labs Lab  10/16/15 1939 10/16/15 1944  WBC 6.6  --   HGB 13.8 15.3*  HCT 41.0 45.0  PLT 161  --   MCV 93.4  --   MCH 31.4  --   MCHC 33.7  --   RDW 14.4  --   LYMPHSABS 2.3  --   MONOABS 0.4  --   EOSABS 0.1  --   BASOSABS 0.0  --     Chemistries   Recent Labs Lab 10/16/15 1939 10/16/15 1944  NA 140 140  K 4.3 4.1  CL 106 105  CO2 23  --   GLUCOSE 125* 122*  BUN 25* 27*  CREATININE 0.91 0.80  CALCIUM 9.6  --   AST 27  --   ALT 20  --   ALKPHOS 75  --   BILITOT 1.1  --    ------------------------------------------------------------------------------------------------------------------ estimated creatinine clearance is 30.7 mL/min (by C-G formula based on Cr of 0.8). ------------------------------------------------------------------------------------------------------------------ No results for input(s): HGBA1C in the last 72 hours. ------------------------------------------------------------------------------------------------------------------  Recent Labs  10/17/15 0538  CHOL 173  HDL 61  LDLCALC 102*  TRIG 52  CHOLHDL 2.8   ------------------------------------------------------------------------------------------------------------------  No results for input(s): TSH, T4TOTAL, T3FREE, THYROIDAB in the last 72 hours.  Invalid input(s): FREET3 ------------------------------------------------------------------------------------------------------------------ No results for input(s): VITAMINB12, FOLATE, FERRITIN, TIBC, IRON, RETICCTPCT in the last 72 hours.  Coagulation profile  Recent Labs Lab 10/16/15 1939  INR 1.14    No results for input(s): DDIMER in the last 72 hours.  Cardiac Enzymes No results for input(s): CKMB, TROPONINI, MYOGLOBIN in the last 168 hours.  Invalid input(s): CK ------------------------------------------------------------------------------------------------------------------ Invalid input(s): POCBNP     Time Spent in minutes   NO  charge   [facility], Braedyn Riggle M.D on 10/17/2015 at 11:13 AM  Between 7am to 7pm - Pager - [phone]  After 7pm go to [email or web address] - password [facility]  [facility]   Office  [phone]

## 2015-10-17 NOTE — Evaluation (Signed)
Occupational Therapy Evaluation Patient Details Name: Jordan Ayala MRN: [id number] DOB: 1911/08/22 Today's Date: 10/17/2015    History of Present Illness Jordan Ayala is a[age] year old female with past medical history of hypothyroidism and dementia; who presents from [facility] with left-sided weakness. MRI: Acute infarct right insula and parietal operculum   Clinical Impression   This 81 yo female admitted with above presents to acute OT with decreased balance, decreased mobility, decreased attention to left, left visual field cut, decreased insight into deficits all affecting her ability to care for herself at an intermittent A level at ALF as she was pta. She will benefit from acute OT with follow up OT at SNF.    Follow Up Recommendations  SNF    Equipment Recommendations  None recommended by OT       Precautions / Restrictions Precautions Precautions: Fall Precaution Comments: left side inattention      Mobility Bed Mobility Overal bed mobility: Needs Assistance Bed Mobility: Supine to Sit     Supine to sit: Mod assist (HOB flat)        Transfers Overall transfer level: Needs assistance   Transfers: Sit to/from Stand;Stand Pivot Transfers Sit to Stand: Mod assist Stand pivot transfers: Mod assist       General transfer comment: Pt stood took one step towards recliner, then stopped (got distracted by pulse ox probe on her finger and forgot what she was doing), had to re cue her 2 times to continue  to turn and sit in recliner    Balance Overall balance assessment: Needs assistance Sitting-balance support: Bilateral upper extremity supported;Feet supported Sitting balance-Leahy Scale: Poor     Standing balance support: Single extremity supported Standing balance-Leahy Scale: Poor                              ADL Overall ADL's : Needs assistance/impaired Eating/Feeding: NPO   Grooming: Moderate assistance;Sitting    Upper Body Bathing: Moderate assistance;Sitting   Lower Body Bathing: Maximal assistance (mod A sit<>stand)   Upper Body Dressing : Maximal assistance;Sitting   Lower Body Dressing: Total assistance (mod A sit<>stand)   Toilet Transfer: Moderate assistance;Stand-pivot (bed>recliner)   Toileting- Clothing Manipulation and Hygiene: Total assistance (with Mod A sit<>stand)               Vision Vision Assessment?: Yes Eye Alignment: Within Functional Limits Ocular Range of Motion: Within Functional Limits Alignment/Gaze Preference: Head turned Tracking/Visual Pursuits: Able to track stimulus in all quads without difficulty Visual Fields: Left homonymous hemianopsia          Pertinent Vitals/Pain Pain Assessment: Faces Faces Pain Scale: Hurts even more Pain Location: head ache Pain Descriptors / Indicators: Aching Pain Intervention(s): Monitored during session;Patient requesting pain meds-RN notified     Hand Dominance Right   Extremity/Trunk Assessment Upper Extremity Assessment Upper Extremity Assessment: LUE deficits/detail LUE Deficits / Details: Brunstrum 2 with decreased attention to left side. Intermittently attempts to use it for Bil UE tasks (putting on and taking off glasses, but unable to hold onto them and then does not try to re grasp them) LUE Coordination: decreased fine motor;decreased gross motor   Lower Extremity Assessment Lower Extremity Assessment: Defer to PT evaluation       Communication Communication Communication: HOH   Cognition Arousal/Alertness: Awake/alert Behavior During Therapy: WFL for tasks assessed/performed Overall Cognitive Status: Impaired/Different from baseline Area of Impairment: Attention;Following commands;Safety/judgement;Problem solving   Current Attention  Level: Sustained   Following Commands: Follows one step commands inconsistently (with increased cuing) Safety/Judgement: Decreased awareness of safety;Decreased  awareness of deficits   Problem Solving: Slow processing;Decreased initiation;Difficulty sequencing;Requires verbal cues;Requires tactile cues General Comments: easily distracted              Home Living Family/patient expects to be discharged to:: Skilled nursing facility                                        Prior Functioning/Environment Level of Independence: Needs assistance  Gait / Transfers Assistance Needed: Used RW at A'd living ADL's / Homemaking Assistance Needed: staff were suppose to A her basic ADLs, but family states that she would not let them        OT Diagnosis: Generalized weakness;Acute pain;Hemiplegia non-dominant side;Disturbance of vision   OT Problem List: Decreased strength;Decreased activity tolerance;Impaired balance (sitting and/or standing);Pain;Decreased safety awareness;Decreased cognition;Impaired vision/perception;Decreased knowledge of use of DME or AE;Impaired tone;Impaired UE functional use   OT Treatment/Interventions: Self-care/ADL training;Patient/family education;Visual/perceptual remediation/compensation;Balance training;Therapeutic activities;Neuromuscular education;DME and/or AE instruction;Cognitive remediation/compensation;Therapeutic exercise    OT Goals(Current goals can be found in the care plan section) Acute Rehab OT Goals Patient Stated Goal: family (to rehab and then see if she can get back to ALF) OT Goal Formulation: With family Time For Goal Achievement: 10/24/15 Potential to Achieve Goals: Good  OT Frequency: Min 2X/week   Barriers to D/C: Decreased caregiver support             End of Session Equipment Utilized During Treatment: Gait belt Nurse Communication: Mobility status;Patient requests pain meds  Activity Tolerance: Patient limited by fatigue Patient left: in chair;with call bell/phone within reach;with chair alarm set   Time: 6213-0865 OT Time Calculation (min): 33 min Charges:  OT  General Charges $OT Visit: 1 Procedure OT Evaluation $Initial OT Evaluation Tier I: 1 Procedure OT Treatments $Self Care/Home Management : 8-22 mins  Evette Georges [phone] 10/17/2015, 10:36 AM

## 2015-10-17 NOTE — H&P (Addendum)
[facility] History and Physical  Benson SettingBernice Centola UUV:[id number] DOB: April 21, 1911 DOA: 10/16/2015  Referring physician:ED PCP: No primary care provider on file.   Chief Complaint: left sided weakness  HPI:  Ms. Jordan Ayala is a81 year old female with past medical history of hypothyroidism and dementia; who presents from [facility] with left-sided weakness. Patient was last seen normal between the times of 1641 - 1800 walking around her room. Then there is report that around 1900 patient wasfound slumped over in a chair not moving her left side. Patient noted to have slurred speech with a left facial droop and unable to move left side. Upon arrival code stroke was called and initial CT scan showed no acute abnormalities.    Review of Systems  Unable to perform ROS: mental status change  Neurological: Positive for speech change and focal weakness. Sensory change:  with family at some point.  Psychiatric/Behavioral: Positive for memory loss.        Past Medical History  Diagnosis Date  . Hyperthyroidism   . Hyperthyroidism      History reviewed. No pertinent past surgical history.    Social History:  reports that she does not drink alcohol. Her tobacco and drug histories are not on file. Lives in a skilled nursing facility.   Allergies  Allergen Reactions  . Codeine Other (See Comments)    Per MAR  . Demerol [Meperidine] Other (See Comments)    Per MAR  . Vioxx [Rofecoxib] Other (See Comments)    Per MAR    No family history on file.      Prior to Admission medications   Medication Sig Start Date End Date Taking? Authorizing Provider  acetaminophen (TYLENOL) 500 MG tablet Take 500 mg by mouth 2 (two) times daily.   Yes Historical Provider, MD  levothyroxine (SYNTHROID, LEVOTHROID) 25 MCG tablet Take 12.5 mcg by mouth daily before breakfast.   Yes Historical Provider, MD  levothyroxine (SYNTHROID, LEVOTHROID) 50 MCG tablet Take 50 mcg by  mouth daily before breakfast.   Yes Historical Provider, MD     Physical Exam: Filed Vitals:   10/16/15 2200 10/16/15 2300 10/17/15 0100 10/17/15 0300  BP: 195/73 184/89 183/77 185/66  Pulse: 48 44 40 42  Temp:   97.4 F (36.3 C) 97.7 F (36.5 C)  TempSrc:   Axillary Axillary  Resp: 20 20 18 18   Height:      Weight:      SpO2: 97% 97% 98% 98%     Constitutional: Vital signs reviewed. Patient is a well-developed and well-nourished elderly female. Alert  Head: Normocephalic and atraumatic  Ear: TM normal bilaterally  Mouth: no erythema or exudates, MMM  Eyes: PERRL, EOMI, conjunctivae normal, No scleral icterus.  Neck: Supple, Trachea midline normal ROM, No JVD, mass, thyromegaly, or carotid bruit present.  Cardiovascular: RRR, S1 normal, S2 normal, no MRG, pulses symmetric and intact bilaterally  Pulmonary/Chest: CTAB, no wheezes, rales, or rhonchi  Abdominal: Soft. Non-tender, non-distended, bowel sounds are normal, no masses, organomegaly, or guarding present.  GU: no CVA tenderness Musculoskeletal: No joint deformities, erythema, or stiffness, ROM full and no nontender Ext:+1 edema of the bilateral lower extremities. and no cyanosis, pulses palpable bilaterally (DP and PT)  Hematology: no cervical, inginal, or axillary adenopathy.  Neurological: Alert. Patient has dysarthria  with a left facial droop. Left-sided neglect and approximately 2 out of 5 strength in legs and arms on the left side. Skin: Warm, dry and intact. No rash, cyanosis, or clubbing.  Psychiatric: Normal mood and affect. speech and behavior is normal. Judgment and thought content normal. Cognition and memory are normal.      Data Review   Micro Results No results found for this or any previous visit (from the past 240 hour(s)).  Radiology Reports Ct Head Wo Contrast  10/16/2015  CLINICAL DATA:  81 year old female old female with code stroke. Left facial droop and left-sided weakness. EXAM: CT HEAD WITHOUT  CONTRAST TECHNIQUE: Contiguous axial images were obtained from the base of the skull through the vertex without intravenous contrast. COMPARISON:  None. FINDINGS: The ventricles are dilated and the sulci are prominent compatible with age-related atrophy. Periventricular and deep white matter hypodensities represent chronic microvascular ischemic changes. There is no intracranial hemorrhage. No mass effect or midline shift identified. The visualized paranasal sinuses and mastoid air cells are well aerated. The calvarium is intact. IMPRESSION: No acute intracranial hemorrhage. Age-related atrophy and chronic microvascular ischemic disease. If symptoms persist and there are no contraindications, MRI may provide better evaluation if clinically indicated. These results were called by telephone at the time of interpretation on 10/16/2015 at 7:53 pm to Dr. Hosie Poisson, who verbally acknowledged these results. Electronically Signed   By: Elgie Collard M.D.   On: 10/16/2015 19:55     CBC  Recent Labs Lab 10/16/15 1939 10/16/15 1944  WBC 6.6  --   HGB 13.8 15.3*  HCT 41.0 45.0  PLT 161  --   MCV 93.4  --   MCH 31.4  --   MCHC 33.7  --   RDW 14.4  --   LYMPHSABS 2.3  --   MONOABS 0.4  --   EOSABS 0.1  --   BASOSABS 0.0  --     Chemistries   Recent Labs Lab 10/16/15 1939 10/16/15 1944  NA 140 140  K 4.3 4.1  CL 106 105  CO2 23  --   GLUCOSE 125* 122*  BUN 25* 27*  CREATININE 0.91 0.80  CALCIUM 9.6  --   AST 27  --   ALT 20  --   ALKPHOS 75  --   BILITOT 1.1  --    ------------------------------------------------------------------------------------------------------------------ estimated creatinine clearance is 30.7 mL/min (by C-G formula based on Cr of 0.8). ------------------------------------------------------------------------------------------------------------------ No results for input(s): HGBA1C in the last 72  hours. ------------------------------------------------------------------------------------------------------------------ No results for input(s): CHOL, HDL, LDLCALC, TRIG, CHOLHDL, LDLDIRECT in the last 72 hours. ------------------------------------------------------------------------------------------------------------------ No results for input(s): TSH, T4TOTAL, T3FREE, THYROIDAB in the last 72 hours.  Invalid input(s): FREET3 ------------------------------------------------------------------------------------------------------------------ No results for input(s): VITAMINB12, FOLATE, FERRITIN, TIBC, IRON, RETICCTPCT in the last 72 hours.  Coagulation profile  Recent Labs Lab 10/16/15 1939  INR 1.14    No results for input(s): DDIMER in the last 72 hours.  Cardiac Enzymes No results for input(s): CKMB, TROPONINI, MYOGLOBIN in the last 168 hours.  Invalid input(s): CK ------------------------------------------------------------------------------------------------------------------ Invalid input(s): POCBNP   CBG:  Recent Labs Lab 10/16/15 2001  GLUCAP 104*       EKG: Independently reviewed.atrial fibrillation   Assessment/Plan Principal Problem:    Acute right MCA stroke [facility]): Patient with significant left-sided deficit in spite of negative initial CT scan. Suspect a large right  MCA stroke based on patient's significant left-sided weakness on exam. Neurology consulted and to see the patient. - admit to telemetry  - follow-up neurology recommendations - Neuro checks - NPO until patient able to pass RN stroke swallow screen - checking HgbA1c, lipid panel - MRI, MRA of the brain without contrast -  echo,carotid Dopplers - PT/OT/SP eval and treat  - social work consult - aspirin  Hypothyroidism - checking TSH free T4 - continue levothyroxine   Atrial fibrillation with bradycardia heart rates in the 40s. Chads score 4,  but would not anticoagulate considering  significant stroke and high risk of bleed. - continue to monitor   Will need to discuss goals of care with family as some confusion of patient's CODE STATUS.   Code Status:   full Family Communication: bedside Disposition Plan: admit   Total time spent 55 minutes.Greater than 50% of this time was spent in counseling, explanation of diagnosis, planning of further management, and coordination of care  Clydie Braun [facility] Pager [phone]  If 7PM-7AM, please contact night-coverage [email or web address] Password [facility] 10/17/2015, 4:39 AM

## 2015-10-17 NOTE — Evaluation (Signed)
Clinical/Bedside Swallow Evaluation Patient Details  Name: Jordan Ayala MRN: [id number] Date of Birth: January 20, 1911  Today's Date: 10/17/2015 Time: SLP Start Time (ACUTE ONLY): 1030 SLP Stop Time (ACUTE ONLY): 1050 SLP Time Calculation (min) (ACUTE ONLY): 20 min  Past Medical History:  Past Medical History  Diagnosis Date  . Hyperthyroidism   . Hyperthyroidism    Past Surgical History: History reviewed. No pertinent past surgical history. HPI:  Jordan Ayala is a46 year old female with past medical history of hypothyroidism and dementia; who presents from [facility] with left-sided weakness. Patient was last seen normal between the times of 1641 - 1800 walking around her room. Then there is report that around 1900 patient wasfound slumped over in a chair not moving her left side. Patient noted to have slurred speech with a left facial droop and unable to move left side. MRI shows Acute infarct right insula and parietal operculum. No further history in chart.    Assessment / Plan / Recommendation Clinical Impression  Pt demonstrates oral dysphagia due to CN VII weakness on left as well as suspected oropharyngeal dysphagia. Pt with multiple delayed swallows followed by coughing with thin and nectar thick liquids. Teaspoons of honey thick liquids are best tolerated, however pt does have some throat clearing after a few trials, suspect some residual that is likely penetrating airway. Discussed findings with pts niece who is power of attorney. She verbalized understanding of risk of aspiration, but would not want to restrict pts comfort. Given these wishes, recommend a dys 1 (puree) diet with honey thick liquids via teaspoon to reduce, but not eliminate, risk of aspiration. Family in agreement, RN aware. Will f/u for tolerance and trials of upgraded textures.     Aspiration Risk  Moderate aspiration risk    Diet Recommendation Dysphagia 1 (Puree);Honey-thick liquid   Liquid  Administration via: Spoon Medication Administration: Whole meds with puree Supervision: Staff to assist with self feeding;Full supervision/cueing for compensatory strategies Compensations: Minimize environmental distractions;Slow rate;Small sips/bites Postural Changes: Seated upright at 90 degrees    Other  Recommendations Oral Care Recommendations: Oral care BID Other Recommendations: Order thickener from pharmacy   Follow up Recommendations  Skilled Nursing facility    Frequency and Duration min 2x/week  2 weeks       Prognosis Prognosis for Safe Diet Advancement: Fair Barriers to Reach Goals: Cognitive deficits      Swallow Study   General HPI: Jordan Ayala is a19 year old female with past medical history of hypothyroidism and dementia; who presents from [facility] with left-sided weakness. Patient was last seen normal between the times of 1641 - 1800 walking around her room. Then there is report that around 1900 patient wasfound slumped over in a chair not moving her left side. Patient noted to have slurred speech with a left facial droop and unable to move left side. MRI shows Acute infarct right insula and parietal operculum. No further history in chart.  Type of Study: Bedside Swallow Evaluation Previous Swallow Assessment: none Diet Prior to this Study: NPO Temperature Spikes Noted: No Respiratory Status: Room air History of Recent Intubation: No Behavior/Cognition: Alert;Cooperative;Pleasant mood;Confused;Requires cueing Oral Cavity Assessment: Within Functional Limits Oral Care Completed by SLP: No Oral Cavity - Dentition: Adequate natural dentition Vision: Functional for self-feeding Self-Feeding Abilities: Needs assist Patient Positioning: Upright in bed Baseline Vocal Quality: Normal Volitional Cough: Strong Volitional Swallow: Able to elicit    Oral/Motor/Sensory Function Overall Oral Motor/Sensory Function: Moderate impairment Facial ROM:  Reduced left;Suspected  CN VII (facial) dysfunction Facial Symmetry: Abnormal symmetry left;Suspected CN VII (facial) dysfunction Facial Strength: Reduced left;Suspected CN VII (facial) dysfunction Facial Sensation: Reduced left;Suspected CN V (Trigeminal) dysfunction Lingual ROM: Reduced left;Suspected CN XII (hypoglossal) dysfunction Lingual Symmetry: Within Functional Limits Lingual Strength: Within Functional Limits Lingual Sensation: Within Functional Limits Velum: Within Functional Limits Mandible: Within Functional Limits   Ice Chips     Thin Liquid Thin Liquid: Impaired Presentation: Cup Pharyngeal  Phase Impairments: Suspected delayed Swallow;Multiple swallows;Wet Vocal Quality;Cough - Delayed    Nectar Thick Nectar Thick Liquid: Impaired Presentation: Spoon Pharyngeal Phase Impairments: Suspected delayed Swallow;Cough - Delayed;Wet Vocal Quality;Multiple swallows   Honey Thick Honey Thick Liquid: Impaired Presentation: Spoon Oral Phase Functional Implications: Prolonged oral transit Pharyngeal Phase Impairments: Suspected delayed Swallow;Throat Clearing - Delayed   Puree Puree: Impaired Oral Phase Functional Implications: Prolonged oral transit Pharyngeal Phase Impairments: Suspected delayed Swallow   Solid Solid: Not tested      Harlon DittyBonnie Klye Besecker, MA CCC-SLP [phone]  Jordan Ayala, Jordan NearingBonnie Ayala 10/17/2015,10:59 AM

## 2015-10-17 NOTE — Progress Notes (Signed)
  Echocardiogram 2D Echocardiogram has been performed.  Tami Blass 10/17/2015, 11:59 AM

## 2015-10-18 ENCOUNTER — Inpatient Hospital Stay (HOSPITAL_COMMUNITY): Payer: Medicare Other

## 2015-10-18 DIAGNOSIS — F0391 Unspecified dementia with behavioral disturbance: Secondary | ICD-10-CM

## 2015-10-18 DIAGNOSIS — I639 Cerebral infarction, unspecified: Secondary | ICD-10-CM

## 2015-10-18 MED ORDER — HALOPERIDOL LACTATE 5 MG/ML IJ SOLN
1.0000 mg | Freq: Four times a day (QID) | INTRAMUSCULAR | Status: DC | PRN
  Administered 2015-10-18: 1 mg via INTRAVENOUS
  Filled 2015-10-18 (×2): qty 1

## 2015-10-18 NOTE — Progress Notes (Signed)
STROKE TEAM PROGRESS NOTE   HISTORY  Jordan Ayala is an 81 y.o. female hx of baseline dementia, currently residing in a NH brought in after being found slumped over with difficulty talking and apparent left-sided weakness. Discussed with NH staff, LSW is unclear. Patient was seen standing by her table around 1800 but no one communicated with her at that time so it is unclear if she was already having speech/mental status changes at that time. Found around 1900 slumped over in her chair not moving her left-side.   CT head imaging reviewed, shows generalized atrophy, no acute process. Initial NIHSS of 23.   Date last known well: 10/16/2015 Time last known well: unclear tPA Given: no, unclear LSW Modified Rankin: Rankin Score=3    SUBJECTIVE (INTERVAL HISTORY) Feels better. No family at bedside. She had a headche, resolved. She is confused about the reason for her admission.  Her memory is poor and she perseverates  OBJECTIVE Temp:  [97.7 F (36.5 C)-98.2 F (36.8 C)] 98.2 F (36.8 C) (12/25 0555) Pulse Rate:  [40-73] 62 (12/25 0555) Cardiac Rhythm:  [-] Atrial fibrillation (12/24 1900) Resp:  [16-20] 20 (12/25 0555) BP: (115-191)/(58-148) 156/82 mmHg (12/25 0555) SpO2:  [96 %-98 %] 98 % (12/25 0555)  CBC:   Recent Labs Lab 10/16/15 1939 10/16/15 1944  WBC 6.6  --   NEUTROABS 3.8  --   HGB 13.8 15.3*  HCT 41.0 45.0  MCV 93.4  --   PLT 161  --     Basic Metabolic Panel:   Recent Labs Lab 10/16/15 1939 10/16/15 1944  NA 140 140  K 4.3 4.1  CL 106 105  CO2 23  --   GLUCOSE 125* 122*  BUN 25* 27*  CREATININE 0.91 0.80  CALCIUM 9.6  --     Lipid Panel:     Component Value Date/Time   CHOL 173 10/17/2015 0538   TRIG 52 10/17/2015 0538   HDL 61 10/17/2015 0538   CHOLHDL 2.8 10/17/2015 0538   VLDL 10 10/17/2015 0538   LDLCALC 102* 10/17/2015 0538   HgbA1c: No results found for: HGBA1C Urine Drug Screen:     Component Value Date/Time    LABOPIA NONE DETECTED 10/16/2015 2027   COCAINSCRNUR NONE DETECTED 10/16/2015 2027   LABBENZ NONE DETECTED 10/16/2015 2027   AMPHETMU NONE DETECTED 10/16/2015 2027   THCU NONE DETECTED 10/16/2015 2027   LABBARB NONE DETECTED 10/16/2015 2027      IMAGING  Dg Chest 2 View 10/17/2015   Cardiomegaly without acute cardiopulmonary findings. Cannot exclude pericardial effusion.    Ct Head Wo Contrast 10/16/2015    No acute intracranial hemorrhage. Age-related atrophy and chronic microvascular ischemic disease. If symptoms persist and there are no contraindications, MRI may provide better evaluation if clinically indicated.    Mr Jordan Ayala Head/brain Wo Cm 10/17/2015   Acute infarct right insula and parietal operculum Advanced atrophy. Chronic microvascular ischemic change in the white matter MRA degraded by motion. Decreased signal middle cerebral arteries bilaterally right greater than left consistent with atherosclerotic disease.   2-D echocardiogram 10/17/2015 Study Conclusions - Left ventricle: The cavity size was normal. Wall thickness was normal. Systolic function was normal. The estimated ejection fraction was in the range of 55% to 60%. Wall motion was normal; there were no regional wall motion abnormalities. - Aortic valve: Valve mobility was restricted. There was mild regurgitation. - Mitral valve: There was mild regurgitation. - Left atrium: The atrium was severely dilated. - Right ventricle: The  cavity size was mildly dilated. - Right atrium: The atrium was severely dilated. - Tricuspid valve: There was moderate-severe regurgitation. - Pulmonary arteries: Systolic pressure was moderately to severely increased. PA peak pressure: 64 mm Hg (S). Impressions: - Normal LV function; severe biatrial enlargement; mild RVE; calcified aortic valve with fixed noncoronary cusp; mild AI; mild MR; moderate to severe TR; moderate to severe elevation in pulmonary pressure;  small pericardial effusion; patient appears to be in atrial fibrillation.     PHYSICAL EXAM Constitutional: He appears well-developed and well-nourished.  Psych: Affect appropriate to situation Eyes: No scleral injection Head: Normocephalic.atraumatic Cardiovascular: Normal rate and regular rhythm.  Respiratory: Effort normal and breath sounds normal.  GI: Soft. Bowel sounds are normal. No distension. There is no tenderness.  Skin: WDI  Neurologic Examination: Mental Status: Alert, oriented to person only. Names intact.  Moderate dysarthria noted but understandable. Intermittently follows simple 2-step commands(close eyes and clap hands), conversant but confused  Cranial Nerves: II: visual fields full t threat, pupils equal, round, reactive to light III,IV, VI: ptosis not present, EOMI CN: V, VII: left sided facial droop noted, denies left sensory loss  VIII: hearing impaired bilaterally IX,X: able to cough XI: shrug strong bilaterally XII: tongue strength normal   Motor: Antigravity left arm; there is a drift in the left UE Some antigravity left leg Right side is stronger Tone and bulk:normal tone throughout; no atrophy noted  Sensory: Left sided hemisensory loss  Cerebellar:  She is very confused by the task, With best effort, dysmetria does appear to be  disproportionate to weakness on the left; right side better  Gait: not tested  ASSESSMENT/PLAN Jordan Ayala SettingBernice Ayala is a [age] y.o. female with history of hypothyroidism and dementia presenting with speech difficulties and left-sided weakness. Afib noted in ED.  She did not receive IV t-PA due to unknown time of onset.  Stroke:  Dominant infarct secondary to small vessel disease.  Resultant  Left-sided hemiparesis and hemisensory loss  MRI - Acute infarct right insula and parietal operculum  MRA - motion degraded  Carotid Doppler - pending  2D Echo - EF 55-60%. No cardiac source of emboli  identified.  LDL - 102  HgbA1c pending  VTE prophylaxis - subcutaneous heparin DIET - DYS 1 Room service appropriate?: Yes; Fluid consistency:: Honey Thick  No antithrombotic prior to admission, now on aspirin 325 mg daily  Patient counseled to be compliant with her antithrombotic medications  Ongoing aggressive stroke risk factor management  Therapy recommendations: SNF recommended  Disposition: Pending  Hypertension  Blood pressure somewhat high  Permissive hypertension (OK if < 220/120) but gradually normalize in 5-7 days  Hyperlipidemia  Home meds:  No lipid lowering medications prior to admission  LDL 102, goal < 70  Now on Lipitor 20 mg daily  Continue statin at discharge  Atrial fibrillation  ER ECG - Afib VR 44 BPM  Bradycardia - (TSH - 4.083 and T4 - 1.06) - (Not on rate slowing medications) Consider cardiology consult.   CHA2DS2-VASc Score = 6 - anticoagulation recommended  Age in Years:>75 = +2 Sex: Female +1 Hypertension History: yes = +1  Diabetes Mellitus: no + 0 Congestive Heart Failure History: no = 0  Vascular Disease History: no = 0 Stroke/TIA/Thromboembolism History: yes= +2  Question if patient is an appropriate candidate for anticoagulation ? Need discussions with family on whether they would like to continue aspirin or NOAC such as Eliquis. My recommendation would be aspirin given age and medical  condition.     Other Stroke Risk Factors  Advanced age  Afib  Other Active Problems  Elevated BUN  Hospital day # 2  Personally examined patient and images, and have participated in and made any corrections needed to history, physical, neuro exam,assessment and plan as stated above.  I have personally obtained the history, evaluated lab date, reviewed imaging studies and agree with radiology interpretations. Awaiting remainder of stroke work-up   To contact  Stroke Continuity provider, please refer to [email or web address]. After hours, contact General Neurology

## 2015-10-18 NOTE — Progress Notes (Signed)
Patient Demographics  Jordan Ayala, is a 81 y.o. female, DOB - 06-14-1911, YQM:[id number]  Admit date - 10/16/2015   Admitting Physician Clydie Braun, MD  Outpatient Primary MD for the patient is No primary care provider on file.  LOS - 2   Chief Complaint  Patient presents with  . Code Stroke       Admission history of present illness/brief narrative: a43 year old female with past medical history of hypothyroidism and dementia; who presents from [facility] with left-sided weakness, Significant for acute right MCA stroke.  Subjective:   [place] Setting today has, No headache, No chest pain, No abdominal pain - No Nausea,  No Cough - SOB.   Assessment & Plan    Principal Problem:   Acute right MCA stroke [facility]) Active Problems:   Hypothyroidism   Atrial fibrillation (HCC)  Acute right MCA stroke  - With residual left-sided weakness, felt secondary to embolic etiology given she found to be in A. Fib. - MRI brain : Acute infarct right insula and parietal operculum - MRA head: Ocean degraded, Atherosclerosis in  middle cerebral arteries right> left - Coated Doppler: Pending - 2-D echo: EF 55-60%, no cardiac source of embolic identified. - LDL 102, started on simvastatin - Hemoglobin A1c pending - Currently on full dose aspirin for antithrombotic, patient with diagnosis of A. Fib, neurology recommending aspirin, giving her age and medical condition  Hypothyroidism - Continue with levothyroxine  New onset A. Fib - new  diagnoses, Chads2Vasc 2 score of 5, currently on aspirin, question if patient will need NOAC giving her advanced age and comorbidities. - Patient in A. fib with slow ventricular response, not in any blocking agents, symptomatic.  Dementia - Continue with supportive care - Patient with an episode of delirium this a.m., will give IV Haldol  once.    Code Status: DO NOT RESUSCITATE, confirmed with her niece who is her healthcare power of attorney  Family Communication: None at bedside  Disposition Plan: Will need SNF , consult placed for social worker   Procedures  None   Consults   Neurology   Medications  Scheduled Meds: . acetaminophen  500 mg Oral BID  . antiseptic oral rinse  7 mL Mouth Rinse q12n4p  . aspirin  300 mg Rectal Daily   Or  . aspirin  325 mg Oral Daily  . atorvastatin  20 mg Oral q1800  . chlorhexidine  15 mL Mouth Rinse BID  . heparin  5,000 Units Subcutaneous 3 times per day  . levothyroxine  62.5 mcg Oral QAC breakfast   Continuous Infusions: . sodium chloride 50 mL/hr at 10/17/15 0039   PRN Meds:.haloperidol lactate, RESOURCE THICKENUP CLEAR, senna-docusate  DVT Prophylaxis   Heparin  Lab Results  Component Value Date   PLT 161 10/16/2015    Antibiotics    Anti-infectives    None          Objective:   Filed Vitals:   10/17/15 2135 10/18/15 0059 10/18/15 0555 10/18/15 1035  BP: 115/89 182/71 156/82 152/78  Pulse: 56 60 62 60  Temp: 98.2 F (36.8 C) 98 F (36.7 C) 98.2 F (36.8 C) 98 F (36.7 C)  TempSrc: Oral Oral Oral Oral  Resp: 20  20 20 20   Height:      Weight:      SpO2: 96% 98% 98% 98%    Wt Readings from Last 3 Encounters:  10/16/15 63.504 kg (140 lb)    No intake or output data in the 24 hours ending 10/18/15 1058   Physical Exam  Awake Alert, Supple Neck,No JVD, Symmetrical Chest wall movement, Good air movement bilaterally, CTAB No Gallops,Rubs or new Murmurs, No Parasternal Heave +ve B.Sounds, Abd Soft, No tenderness, No organomegaly appriciated, No rebound - guarding or rigidity. No Cyanosis, Clubbing or edema, minimal left lower extremity weakness.   Data Review   Micro Results Recent Results (from the past 240 hour(s))  MRSA PCR Screening     Status: Abnormal   Collection Time: 10/17/15  3:34 AM  Result Value Ref Range Status    MRSA by PCR POSITIVE (A) NEGATIVE Final    Comment:        The GeneXpert MRSA Assay (FDA approved for NASAL specimens only), is one component of a comprehensive MRSA colonization surveillance program. It is not intended to diagnose MRSA infection nor to guide or monitor treatment for MRSA infections. RESULT CALLED TO, READ BACK BY AND VERIFIED WITH: RICH @0637  10/17/15 [facility]     Radiology Reports Dg Eye Foreign Body  10/17/2015  CLINICAL DATA:  Metal working/exposure; clearance prior to MRI EXAM: ORBITS FOR FOREIGN BODY - 2 VIEW COMPARISON:  None. FINDINGS: There is no evidence of metallic foreign body within the orbits. No significant bone abnormality identified. IMPRESSION: No evidence of metallic foreign body within the orbits. Electronically Signed   By: Sherian ReinWei-Chen  Lin M.D.   On: 10/17/2015 09:26   Dg Chest 2 View  10/17/2015  CLINICAL DATA:  Stroke EXAM: CHEST  2 VIEW COMPARISON:  None. FINDINGS: Stable enlarged cardiac silhouette. No effusion, infiltrate, pneumothorax. No acute osseous abnormality. Calcified hilar lymph node. IMPRESSION: Cardiomegaly without acute cardiopulmonary findings. Cannot exclude pericardial effusion. Electronically Signed   By: Genevive BiStewart  Edmunds M.D.   On: 10/17/2015 08:45   Dg Abd 1 View  10/17/2015  CLINICAL DATA:  Stroke EXAM: ABDOMEN - 1 VIEW COMPARISON:  None FINDINGS: There is a moderate stool burden identified throughout the colon. No dilated small bowel loops. No abnormal bowel dilatation. IMPRESSION: Moderate stool burden in the colon.  No obstruction. Electronically Signed   By: Signa Kellaylor  Stroud M.D.   On: 10/17/2015 09:38   Ct Head Wo Contrast  10/16/2015  CLINICAL DATA:  81 year old female with code stroke. Left facial droop and left-sided weakness. EXAM: CT HEAD WITHOUT CONTRAST TECHNIQUE: Contiguous axial images were obtained from the base of the skull through the vertex without intravenous contrast. COMPARISON:  None. FINDINGS: The  ventricles are dilated and the sulci are prominent compatible with age-related atrophy. Periventricular and deep white matter hypodensities represent chronic microvascular ischemic changes. There is no intracranial hemorrhage. No mass effect or midline shift identified. The visualized paranasal sinuses and mastoid air cells are well aerated. The calvarium is intact. IMPRESSION: No acute intracranial hemorrhage. Age-related atrophy and chronic microvascular ischemic disease. If symptoms persist and there are no contraindications, MRI may provide better evaluation if clinically indicated. These results were called by telephone at the time of interpretation on 10/16/2015 at 7:53 pm to Dr. Hosie PoissonSumner, who verbally acknowledged these results. Electronically Signed   By: Elgie CollardArash  Radparvar M.D.   On: 10/16/2015 19:55   Mr Brain Wo Contrast  10/17/2015  CLINICAL DATA:  Left-sided weakness.  Stroke. EXAM: MRI  HEAD WITHOUT CONTRAST MRA HEAD WITHOUT CONTRAST TECHNIQUE: Multiplanar, multiecho pulse sequences of the brain and surrounding structures were obtained without intravenous contrast. Angiographic images of the head were obtained using MRA technique without contrast. COMPARISON:  CT head 10/16/2015 FINDINGS: MRI HEAD FINDINGS Acute infarct involving the right posterior insula and parietal operculum. No other areas of acute infarct. Advanced atrophy with prominent ventricles and subarachnoid space diffusely. Chronic microvascular ischemic change in the white matter. Mild chronic ischemia in the pons. Negative for hemorrhage or fluid collection Negative for mass or edema.  No shift of the midline structures Normal pituitary.  Paranasal sinuses clear. MRA HEAD FINDINGS Image quality degraded by motion. Small right vertebral artery is hypoplastic and possibly disease. Small contribution to the basilar. Distal left vertebral artery patent to the basilar. Basilar is irregular compatible with mild atherosclerotic disease. Fetal  origin right posterior cerebral artery with hypoplastic right P1 segment. Both posterior cerebral arteries are patent. Internal carotid artery patent bilaterally. Anterior cerebral arteries patent bilaterally. M1 segment patent bilaterally. Decreased signal in the middle cerebral artery branches bilaterally right greater than left. Probable moderate stenosis of the right MCA bifurcation. Bilateral MCA atherosclerotic disease No aneurysm identified IMPRESSION: Acute infarct right insula and parietal operculum Advanced atrophy. Chronic microvascular ischemic change in the white matter MRA degraded by motion. Decreased signal middle cerebral arteries bilaterally right greater than left consistent with atherosclerotic disease. Electronically Signed   By: Marlan Palau M.D.   On: 10/17/2015 08:21   Mr Maxine Glenn Head/brain Wo Cm  10/17/2015  CLINICAL DATA:  Left-sided weakness.  Stroke. EXAM: MRI HEAD WITHOUT CONTRAST MRA HEAD WITHOUT CONTRAST TECHNIQUE: Multiplanar, multiecho pulse sequences of the brain and surrounding structures were obtained without intravenous contrast. Angiographic images of the head were obtained using MRA technique without contrast. COMPARISON:  CT head 10/16/2015 FINDINGS: MRI HEAD FINDINGS Acute infarct involving the right posterior insula and parietal operculum. No other areas of acute infarct. Advanced atrophy with prominent ventricles and subarachnoid space diffusely. Chronic microvascular ischemic change in the white matter. Mild chronic ischemia in the pons. Negative for hemorrhage or fluid collection Negative for mass or edema.  No shift of the midline structures Normal pituitary.  Paranasal sinuses clear. MRA HEAD FINDINGS Image quality degraded by motion. Small right vertebral artery is hypoplastic and possibly disease. Small contribution to the basilar. Distal left vertebral artery patent to the basilar. Basilar is irregular compatible with mild atherosclerotic disease. Fetal origin  right posterior cerebral artery with hypoplastic right P1 segment. Both posterior cerebral arteries are patent. Internal carotid artery patent bilaterally. Anterior cerebral arteries patent bilaterally. M1 segment patent bilaterally. Decreased signal in the middle cerebral artery branches bilaterally right greater than left. Probable moderate stenosis of the right MCA bifurcation. Bilateral MCA atherosclerotic disease No aneurysm identified IMPRESSION: Acute infarct right insula and parietal operculum Advanced atrophy. Chronic microvascular ischemic change in the white matter MRA degraded by motion. Decreased signal middle cerebral arteries bilaterally right greater than left consistent with atherosclerotic disease. Electronically Signed   By: Marlan Palau M.D.   On: 10/17/2015 08:21     CBC  Recent Labs Lab 10/16/15 1939 10/16/15 1944  WBC 6.6  --   HGB 13.8 15.3*  HCT 41.0 45.0  PLT 161  --   MCV 93.4  --   MCH 31.4  --   MCHC 33.7  --   RDW 14.4  --   LYMPHSABS 2.3  --   MONOABS 0.4  --   EOSABS  0.1  --   BASOSABS 0.0  --     Chemistries   Recent Labs Lab 10/16/15 1939 10/16/15 1944  NA 140 140  K 4.3 4.1  CL 106 105  CO2 23  --   GLUCOSE 125* 122*  BUN 25* 27*  CREATININE 0.91 0.80  CALCIUM 9.6  --   AST 27  --   ALT 20  --   ALKPHOS 75  --   BILITOT 1.1  --    ------------------------------------------------------------------------------------------------------------------ estimated creatinine clearance is 30.7 mL/min (by C-G formula based on Cr of 0.8). ------------------------------------------------------------------------------------------------------------------ No results for input(s): HGBA1C in the last 72 hours. ------------------------------------------------------------------------------------------------------------------  Recent Labs  10/17/15 0538  CHOL 173  HDL 61  LDLCALC 102*  TRIG 52  CHOLHDL 2.8    ------------------------------------------------------------------------------------------------------------------  Recent Labs  10/17/15 1535  TSH 4.083   ------------------------------------------------------------------------------------------------------------------ No results for input(s): VITAMINB12, FOLATE, FERRITIN, TIBC, IRON, RETICCTPCT in the last 72 hours.  Coagulation profile  Recent Labs Lab 10/16/15 1939  INR 1.14    No results for input(s): DDIMER in the last 72 hours.  Cardiac Enzymes No results for input(s): CKMB, TROPONINI, MYOGLOBIN in the last 168 hours.  Invalid input(s): CK ------------------------------------------------------------------------------------------------------------------ Invalid input(s): POCBNP     Time Spent in minutes   25 minutes   Alba Kriesel M.D on 10/18/2015 at 10:58 AM  Between 7am to 7pm - Pager - [phone]  After 7pm go to [email or web address] - password [facility]  [facility]   Office  [phone]

## 2015-10-19 ENCOUNTER — Encounter (HOSPITAL_COMMUNITY): Payer: Self-pay | Admitting: *Deleted

## 2015-10-19 DIAGNOSIS — I481 Persistent atrial fibrillation: Secondary | ICD-10-CM

## 2015-10-19 DIAGNOSIS — E785 Hyperlipidemia, unspecified: Secondary | ICD-10-CM

## 2015-10-19 MED ORDER — AMLODIPINE BESYLATE 5 MG PO TABS
5.0000 mg | ORAL_TABLET | Freq: Every day | ORAL | Status: DC
  Administered 2015-10-19 – 2015-10-20 (×2): 5 mg via ORAL
  Filled 2015-10-19 (×2): qty 1

## 2015-10-19 MED ORDER — APIXABAN 2.5 MG PO TABS
2.5000 mg | ORAL_TABLET | Freq: Two times a day (BID) | ORAL | Status: DC
Start: 2015-10-20 — End: 2015-10-20
  Administered 2015-10-20: 2.5 mg via ORAL
  Filled 2015-10-19: qty 1

## 2015-10-19 NOTE — Progress Notes (Signed)
Patient Demographics  Jordan SettingBernice Ayala, is a 81 y.o. female, DOB - Mar 14, 1911, ZOX:[id number]  Admit date - 10/16/2015   Admitting Physician Jordan Braunondell A Smith, MD  Outpatient Primary MD for the patient is No primary care Jordan Ayala on file.  LOS - 3   Chief Complaint  Patient presents with  . Code Stroke       Admission history of present illness/brief narrative: a[age] year old female with past medical history of hypothyroidism and dementia; who [facility] from [facility] with left-sided weakness, Significant for acute right MCA stroke.  Subjective:   Jordan Ayala today has, No headache, No chest pain, No abdominal pain - No Nausea,  No Cough - SOB.   Assessment & Plan    Principal Problem:   Acute right MCA stroke [facility](HCC) Active Problems:   Hypothyroidism   Atrial fibrillation (HCC)  Acute right MCA stroke  - With residual left-sided weakness, felt secondary to embolic etiology given she found to be in A. Fib. - MRI brain : Acute infarct right insula and parietal operculum - MRA head: Ocean degraded, Atherosclerosis in  middle cerebral arteries right> left - Coated Doppler: Pending - 2-D echo: EF 55-60%, no cardiac source of embolic identified. - LDL 102, started on simvastatin - Hemoglobin A1c pending - Currently on full dose aspirin for antithrombotic, patient with diagnosis of A. Fib, neurology recommending aspirin, giving her age and medical condition  Hypothyroidism - Continue with levothyroxine  New onset A. Fib - new  diagnoses, Chads2Vasc 2 score of 5, currently on aspirin, question if patient will need NOAC giving her advanced age and comorbidities. - Patient in A. fib with slow ventricular response, not in any blocking agents, asymptomatic.  Dementia - Continue with supportive care - Patient with an episode of delirium this a.m., will give IV Haldol  once.    Code Status: DO NOT RESUSCITATE, confirmed with her niece who is her healthcare power of attorney  Family Communication: None at bedside  Disposition Plan: awaiting SNF discharge, as patient awaiting [facility]   Procedures  None   Consults   Neurology   Medications  Scheduled Meds: . acetaminophen  500 mg Oral BID  . antiseptic oral rinse  7 mL Mouth Rinse q12n4p  . aspirin  300 mg Rectal Daily   Or  . aspirin  325 mg Oral Daily  . atorvastatin  20 mg Oral q1800  . chlorhexidine  15 mL Mouth Rinse BID  . heparin  5,000 Units Subcutaneous 3 times per day  . levothyroxine  62.5 mcg Oral QAC breakfast   Continuous Infusions: . sodium chloride 50 mL/hr at 10/17/15 0039   PRN Meds:.haloperidol lactate, RESOURCE THICKENUP CLEAR, senna-docusate  DVT Prophylaxis   Heparin  Lab Results  Component Value Date   PLT 161 10/16/2015    Antibiotics    Anti-infectives    None          Objective:   Filed Vitals:   10/19/15 0136 10/19/15 0549 10/19/15 0959 10/19/15 1334  BP: 150/56 173/60 175/51 150/76  Pulse: 71 73 60 57  Temp: 97.7 F (36.5 C) 98.6 F (37 C) 97.7 F (36.5 C) 98.1 F (36.7 C)  TempSrc: Oral Oral Oral Oral  Resp: 16 16 16  16  Height:      Weight:      SpO2: 98% 98% 96% 98%    Wt Readings from Last 3 Encounters:  10/16/15 63.504 kg (140 lb)    No intake or output data in the 24 hours ending 10/19/15 1343   Physical Exam  Awake Alert, Supple Neck,No JVD, Symmetrical Chest wall movement, Good air movement bilaterally, CTAB No Gallops,Rubs or new Murmurs, No Parasternal Heave +ve B.Sounds, Abd Soft, No tenderness, No organomegaly appriciated, No rebound - guarding or rigidity. No Cyanosis, Clubbing or edema, minimal left lower extremity weakness.   Data Review   Micro Results Recent Results (from the past 240 hour(s))  MRSA PCR Screening     Status: Abnormal   Collection Time: 10/17/15  3:34 AM  Result Value Ref Range  Status   MRSA by PCR POSITIVE (A) NEGATIVE Final    Comment:        The GeneXpert MRSA Assay (FDA approved for NASAL specimens only), is one component of a comprehensive MRSA colonization surveillance program. It is not intended to diagnose MRSA infection nor to guide or monitor treatment for MRSA infections. RESULT CALLED TO, READ BACK BY AND VERIFIED WITH: RICH @0637  10/17/15 [facility]     Radiology Reports Dg Eye Foreign Body  10/17/2015  CLINICAL DATA:  Metal working/exposure; clearance prior to MRI EXAM: ORBITS FOR FOREIGN BODY - 2 VIEW COMPARISON:  None. FINDINGS: There is no evidence of metallic foreign body within the orbits. No significant bone abnormality identified. IMPRESSION: No evidence of metallic foreign body within the orbits. Electronically Signed   By: Sherian Rein M.D.   On: 10/17/2015 09:26   Dg Chest 2 View  10/17/2015  CLINICAL DATA:  Stroke EXAM: CHEST  2 VIEW COMPARISON:  None. FINDINGS: Stable enlarged cardiac silhouette. No effusion, infiltrate, pneumothorax. No acute osseous abnormality. Calcified hilar lymph node. IMPRESSION: Cardiomegaly without acute cardiopulmonary findings. Cannot exclude pericardial effusion. Electronically Signed   By: Genevive Bi M.D.   On: 10/17/2015 08:45   Dg Abd 1 View  10/17/2015  CLINICAL DATA:  Stroke EXAM: ABDOMEN - 1 VIEW COMPARISON:  None FINDINGS: There is a moderate stool burden identified throughout the colon. No dilated small bowel loops. No abnormal bowel dilatation. IMPRESSION: Moderate stool burden in the colon.  No obstruction. Electronically Signed   By: Signa Kell M.D.   On: 10/17/2015 09:38   Ct Head Wo Contrast  10/16/2015  CLINICAL DATA:  81 year old female with code stroke. Left facial droop and left-sided weakness. EXAM: CT HEAD WITHOUT CONTRAST TECHNIQUE: Contiguous axial images were obtained from the base of the skull through the vertex without intravenous contrast. COMPARISON:  None. FINDINGS: The  ventricles are dilated and the sulci are prominent compatible with age-related atrophy. Periventricular and deep white matter hypodensities represent chronic microvascular ischemic changes. There is no intracranial hemorrhage. No mass effect or midline shift identified. The visualized paranasal sinuses and mastoid air cells are well aerated. The calvarium is intact. IMPRESSION: No acute intracranial hemorrhage. Age-related atrophy and chronic microvascular ischemic disease. If symptoms persist and there are no contraindications, MRI may provide better evaluation if clinically indicated. These results were called by telephone at the time of interpretation on 10/16/2015 at 7:53 pm to Dr. Hosie Poisson, who verbally acknowledged these results. Electronically Signed   By: Elgie Collard M.D.   On: 10/16/2015 19:55   Mr Brain Wo Contrast  10/17/2015  CLINICAL DATA:  Left-sided weakness.  Stroke. EXAM: MRI HEAD WITHOUT  CONTRAST MRA HEAD WITHOUT CONTRAST TECHNIQUE: Multiplanar, multiecho pulse sequences of the brain and surrounding structures were obtained without intravenous contrast. Angiographic images of the head were obtained using MRA technique without contrast. COMPARISON:  CT head 10/16/2015 FINDINGS: MRI HEAD FINDINGS Acute infarct involving the right posterior insula and parietal operculum. No other areas of acute infarct. Advanced atrophy with prominent ventricles and subarachnoid space diffusely. Chronic microvascular ischemic change in the white matter. Mild chronic ischemia in the pons. Negative for hemorrhage or fluid collection Negative for mass or edema.  No shift of the midline structures Normal pituitary.  Paranasal sinuses clear. MRA HEAD FINDINGS Image quality degraded by motion. Small right vertebral artery is hypoplastic and possibly disease. Small contribution to the basilar. Distal left vertebral artery patent to the basilar. Basilar is irregular compatible with mild atherosclerotic disease. Fetal  origin right posterior cerebral artery with hypoplastic right P1 segment. Both posterior cerebral arteries are patent. Internal carotid artery patent bilaterally. Anterior cerebral arteries patent bilaterally. M1 segment patent bilaterally. Decreased signal in the middle cerebral artery branches bilaterally right greater than left. Probable moderate stenosis of the right MCA bifurcation. Bilateral MCA atherosclerotic disease No aneurysm identified IMPRESSION: Acute infarct right insula and parietal operculum Advanced atrophy. Chronic microvascular ischemic change in the white matter MRA degraded by motion. Decreased signal middle cerebral arteries bilaterally right greater than left consistent with atherosclerotic disease. Electronically Signed   By: Marlan Palau M.D.   On: 10/17/2015 08:21   Mr Maxine Glenn Head/brain Wo Cm  10/17/2015  CLINICAL DATA:  Left-sided weakness.  Stroke. EXAM: MRI HEAD WITHOUT CONTRAST MRA HEAD WITHOUT CONTRAST TECHNIQUE: Multiplanar, multiecho pulse sequences of the brain and surrounding structures were obtained without intravenous contrast. Angiographic images of the head were obtained using MRA technique without contrast. COMPARISON:  CT head 10/16/2015 FINDINGS: MRI HEAD FINDINGS Acute infarct involving the right posterior insula and parietal operculum. No other areas of acute infarct. Advanced atrophy with prominent ventricles and subarachnoid space diffusely. Chronic microvascular ischemic change in the white matter. Mild chronic ischemia in the pons. Negative for hemorrhage or fluid collection Negative for mass or edema.  No shift of the midline structures Normal pituitary.  Paranasal sinuses clear. MRA HEAD FINDINGS Image quality degraded by motion. Small right vertebral artery is hypoplastic and possibly disease. Small contribution to the basilar. Distal left vertebral artery patent to the basilar. Basilar is irregular compatible with mild atherosclerotic disease. Fetal origin  right posterior cerebral artery with hypoplastic right P1 segment. Both posterior cerebral arteries are patent. Internal carotid artery patent bilaterally. Anterior cerebral arteries patent bilaterally. M1 segment patent bilaterally. Decreased signal in the middle cerebral artery branches bilaterally right greater than left. Probable moderate stenosis of the right MCA bifurcation. Bilateral MCA atherosclerotic disease No aneurysm identified IMPRESSION: Acute infarct right insula and parietal operculum Advanced atrophy. Chronic microvascular ischemic change in the white matter MRA degraded by motion. Decreased signal middle cerebral arteries bilaterally right greater than left consistent with atherosclerotic disease. Electronically Signed   By: Marlan Palau M.D.   On: 10/17/2015 08:21     CBC  Recent Labs Lab 10/16/15 1939 10/16/15 1944  WBC 6.6  --   HGB 13.8 15.3*  HCT 41.0 45.0  PLT 161  --   MCV 93.4  --   MCH 31.4  --   MCHC 33.7  --   RDW 14.4  --   LYMPHSABS 2.3  --   MONOABS 0.4  --   EOSABS 0.1  --  BASOSABS 0.0  --     Chemistries   Recent Labs Lab 10/16/15 1939 10/16/15 1944  NA 140 140  K 4.3 4.1  CL 106 105  CO2 23  --   GLUCOSE 125* 122*  BUN 25* 27*  CREATININE 0.91 0.80  CALCIUM 9.6  --   AST 27  --   ALT 20  --   ALKPHOS 75  --   BILITOT 1.1  --    ------------------------------------------------------------------------------------------------------------------ estimated creatinine clearance is 30.7 mL/min (by C-G formula based on Cr of 0.8). ------------------------------------------------------------------------------------------------------------------ No results for input(s): HGBA1C in the last 72 hours. ------------------------------------------------------------------------------------------------------------------  Recent Labs  10/17/15 0538  CHOL 173  HDL 61  LDLCALC 102*  TRIG 52  CHOLHDL 2.8    ------------------------------------------------------------------------------------------------------------------  Recent Labs  10/17/15 1535  TSH 4.083   ------------------------------------------------------------------------------------------------------------------ No results for input(s): VITAMINB12, FOLATE, FERRITIN, TIBC, IRON, RETICCTPCT in the last 72 hours.  Coagulation profile  Recent Labs Lab 10/16/15 1939  INR 1.14    No results for input(s): DDIMER in the last 72 hours.  Cardiac Enzymes No results for input(s): CKMB, TROPONINI, MYOGLOBIN in the last 168 hours.  Invalid input(s): CK ------------------------------------------------------------------------------------------------------------------ Invalid input(s): POCBNP     Time Spent in minutes   20 minutes   ELGERGAWY, DAWOOD M.D on 10/19/2015 at 1:43 PM  Between 7am to 7pm - Pager - [phone]  After 7pm go to [email or web address] - password [facility]  [facility]   Office  [phone]

## 2015-10-19 NOTE — Progress Notes (Signed)
ANTICOAGULATION CONSULT NOTE - Initial Consult  Pharmacy Consult for apixaban (Eliquis) Indication: TIA, afib  Patient Measurements: Height: 5\' 4"  (162.6 cm) Weight: 140 lb (63.504 kg) IBW/kg (Calculated) : 54.7  Vital Signs: Temp: 98.1 F (36.7 C) (12/26 1334) Temp Source: Oral (12/26 1334) BP: 150/76 mmHg (12/26 1334) Pulse Rate: 57 (12/26 1334)  Labs:  Recent Labs  10/16/15 1939 10/16/15 1944  HGB 13.8 15.3*  HCT 41.0 45.0  PLT 161  --   APTT 29  --   LABPROT 14.7  --   INR 1.14  --   CREATININE 0.91 0.80   Assessment: 89 yoF admitted on 12/23 from NH with concern for TIA. On admission was found to be in afib with concern for R MCA infarct, likely embolic in origin per neurology. Initially started on aspirin but discontinued with plans to transition to Apixaban.   Goal of Therapy:  Monitor platelets by anticoagulation protocol: Yes   Plan:  1. Apixaban 2.5 mg BID to start on 12/27; lower dose based on advanced age, borderline weight and difficult to determine renal function 2. D/c subq heparin for DVT prophylaxis after this pm dose  3. Risk/benefit of long term anticoagulation per primary team    Pollyann SamplesAndy Carlyn Lemke, PharmD, BCPS 10/19/2015, 4:04 PM Pager: [phone]

## 2015-10-19 NOTE — Progress Notes (Signed)
STROKE TEAM PROGRESS NOTE   HISTORY  Jordan Ayala SettingBernice Ayala is an [age] y.o. female hx of baseline dementia, currently residing in a NH brought in after being found slumped over with difficulty talking and apparent left-sided weakness. Discussed with NH staff, LSW is unclear. Patient was seen standing by her table around 1800 but no one communicated with her at that time so it is unclear if she was already having speech/mental status changes at that time. Found around 1900 slumped over in her chair not moving her left-side. She was last known well 10/16/2015 time unclear. Modified Rankin: Rankin Score=3. CT head imaging reviewed, shows generalized atrophy, no acute process. Initial NIHSS of 23. tPA not given due to unclear LSW   SUBJECTIVE (INTERVAL HISTORY) No family at bedside, pt is sitting in chair, hard of hearing but stated that she lives alone in apartment. Son helps for grocery shopping. Not drive anymore.   OBJECTIVE Temp:  [97.7 F (36.5 C)-98.6 F (37 C)] 98.1 F (36.7 C) (12/26 1334) Pulse Rate:  [57-73] 57 (12/26 1334) Cardiac Rhythm:  [-] Atrial fibrillation (12/26 0700) Resp:  [16-18] 16 (12/26 1334) BP: (112-184)/(51-81) 150/76 mmHg (12/26 1334) SpO2:  [96 %-99 %] 98 % (12/26 1334)  CBC:   Recent Labs Lab 10/16/15 1939 10/16/15 1944  WBC 6.6  --   NEUTROABS 3.8  --   HGB 13.8 15.3*  HCT 41.0 45.0  MCV 93.4  --   PLT 161  --     Basic Metabolic Panel:   Recent Labs Lab 10/16/15 1939 10/16/15 1944  NA 140 140  K 4.3 4.1  CL 106 105  CO2 23  --   GLUCOSE 125* 122*  BUN 25* 27*  CREATININE 0.91 0.80  CALCIUM 9.6  --     Lipid Panel:     Component Value Date/Time   CHOL 173 10/17/2015 0538   TRIG 52 10/17/2015 0538   HDL 61 10/17/2015 0538   CHOLHDL 2.8 10/17/2015 0538   VLDL 10 10/17/2015 0538   LDLCALC 102* 10/17/2015 0538   HgbA1c: No results found for: HGBA1C Urine Drug Screen:     Component Value Date/Time   LABOPIA NONE DETECTED 10/16/2015  2027   COCAINSCRNUR NONE DETECTED 10/16/2015 2027   LABBENZ NONE DETECTED 10/16/2015 2027   AMPHETMU NONE DETECTED 10/16/2015 2027   THCU NONE DETECTED 10/16/2015 2027   LABBARB NONE DETECTED 10/16/2015 2027      IMAGING I have personally reviewed the radiological images below and agree with the radiology interpretations.  Dg Chest 2 View 10/17/2015   Cardiomegaly without acute cardiopulmonary findings. Cannot exclude pericardial effusion.   Ct Head Wo Contrast 10/16/2015    No acute intracranial hemorrhage. Age-related atrophy and chronic microvascular ischemic disease. If symptoms persist and there are no contraindications, MRI may provide better evaluation if clinically indicated.   Mri & Mra Head/brain Wo Cm 10/17/2015   Acute infarct right insula and parietal operculum Advanced atrophy. Chronic microvascular ischemic change in the white matter MRA degraded by motion. Decreased signal middle cerebral arteries bilaterally right greater than left consistent with atherosclerotic disease.   2D Echocardiogram  - Left ventricle: The cavity size was normal. Wall thickness was normal. Systolic function was normal. The estimated ejection fraction was in the range of 55% to 60%. Wall motion was normal;there were no regional wall motion abnormalities. - Aortic valve: Valve mobility was restricted. There was mildregurgitation. - Mitral valve: There was mild regurgitation. - Left atrium: The atrium was severely  dilated. - Right ventricle: The cavity size was mildly dilated. - Right atrium: The atrium was severely dilated. - Tricuspid valve: There was moderate-severe regurgitation. - Pulmonary arteries: Systolic pressure was moderately to severelyincreased. PA peak pressure: 64 mm Hg (S). Impressions:   Normal LV function; severe biatrial enlargement; mild RVE; calcified aortic valve with fixed noncoronary cusp; mild AI; mild MR; moderate to severe TR; moderate to severe elevation in  pulmonary pressure; small pericardial effusion; patient appears to be in atrial fibrillation.  CUS - pending    PHYSICAL EXAM Physical Exam  Constitutional: He appears well-developed and well-nourished.  Psych: Affect appropriate to situation Eyes: No scleral injection HENT: No OP obstrucion Head: Normocephalic.  Cardiovascular: irregularly irregular heart rate and rhythm  Respiratory: Effort normal and breath sounds normal.  GI: Soft. Bowel sounds are normal. No distension. There is no tenderness.  Skin: WDI  Neurologic Examination: Mental Status: Alert, oriented to person only. Names glasses, gloves, blanket.  Moderate dysarthria noted but understandable. Intermittently follows simple 2-step commands(close eyes and clap hands), conversant Cranial Nerves: II: unable to visualize optic discs, can count fingers in every quadrant, pupils equal, round, reactive to light III,IV, VI: ptosis not present, EOMI CN: V, VII: left sided facial droop noted, denies left sensoty loss extinguishes on the left  VIII: hearing impaired bilaterally IX,X: gag reflex present XI: trapezius strength/neck flexion strength normal bilaterally XII: tongue strength normal  Motor: Antigravity left arm Some antigravity left leg Tone and bulk:normal tone throughout; no atrophy noted Sensory: Left sided hemisensory loss, extinguishes Deep Tendon Reflexes: 2+ and symmetric throughout Plantars: Right: downgoingLeft: downgoing Cerebellar: Unable to test Gait: needs assistance to ambulate with walker. Stooped posture, shuffling gait.   ASSESSMENT/PLAN Ms. Jordan Ayala is a 81 y.o. female with history of hypothyroidism and dementia presenting with speech difficulties and left-sided weakness. Afib noted in ED.  She did not receive IV t-PA due to unknown time of onset.  Stroke:  Dominant right insular and parietal opercular infarct, embolic pattern secondary to  aifb  Resultant  Moderate dysarthria  MRI - Acute infarct right insula and parietal operculum  MRA - motion degraded diffuse athero  Carotid Doppler - pending  2D Echo - EF 55-60%. No cardiac source of emboli identified. Pt in afib.  LDL - 102  HgbA1c pending  VTE prophylaxis - subcutaneous heparin DIET - DYS 1 Room service appropriate?: Yes; Fluid consistency:: Honey Thick  No antithrombotic prior to admission, now on aspirin 325 mg daily. Due to high functioning at home and without contraindication, recommend anticoagulation with NOAC such as eliquis.   Patient counseled to be compliant with her antithrombotic medications  Ongoing aggressive stroke risk factor management  Therapy recommendations: SNF recommended  Disposition: Pending  Hypertension  Stabilizing  Recommend better BP control due to on NOAC   BP goal 120-140  Hyperlipidemia  Home meds:  No lipid lowering medications prior to admission  LDL 102, goal < 70  Now on Lipitor 20 mg daily  Continue statin at discharge  Atrial fibrillation  ER ECG - Afib VR 44 BPM  Bradycardia - TSH and T4 normal   CHA2DS2-VASc Score = 6 - anticoagulation recommended    Age in Years:>75 = +2  Sex: Female +1  Hypertension History: yes = +1   Diabetes Mellitus: no + 0  Congestive Heart Failure History: no = 0   Vascular Disease History: no = 0  Stroke/TIA/Thromboembolism History: yes= +2 Due to high functioning at home and lives  alone, recommend NOAC with eliquis.   Other Stroke Risk Factors  Advanced age    Other Active Problems  Elevated BUN  Hospital day # 3  Neurology will sign off. Please call with questions. Pt will follow up with Dr. Roda Shutters at [facility] in about 2 months. Thanks for the consult.  Marvel Plan, MD PhD Stroke Neurology 10/20/2015 1:47 AM  To contact Stroke Continuity provider, please refer to [email or web address]. After hours,  contact General Neurology

## 2015-10-19 NOTE — [place] FL2 (Signed)
[place] MEDICAID FL2 LEVEL OF CARE SCREENING TOOL     IDENTIFICATION  Patient Name: Toby Ayad Birthdate: Sep 06, 1911 Sex: female Admission Date (Current Location): 10/16/2015  [facility] and [place] Number:  Producer, television/film/video and Address:  [facility] [place], [address] [place], [place] [address]      Provider Number: [id number]  Attending Physician Name and Address:  Starleen Arms, MD  Relative Name and Phone Number:  Ernest Haber, Niece [phone] or [phone] or Gilles Chiquito at [phone]    Current Level of Care: Hospital Recommended Level of Care: Skilled Nursing Facility Prior Approval Number:    Date Approved/Denied:   PASRR Number: [id number]  Discharge Plan: SNF    Current Diagnoses: Patient Active Problem List   Diagnosis Date Noted  . Hypothyroidism 10/17/2015  . Atrial fibrillation (HCC) 10/17/2015  . Acute right MCA stroke (HCC) 10/16/2015    Orientation RESPIRATION BLADDER Height & Weight    Self  Normal Continent  (165.1 cm) 140 lbs.  BEHAVIORAL SYMPTOMS/MOOD NEUROLOGICAL BOWEL NUTRITION STATUS      Continent Diet  AMBULATORY STATUS COMMUNICATION OF NEEDS Skin   Limited Assist Verbally Normal                       Personal Care Assistance Level of Assistance  Dressing, Bathing Bathing Assistance: Limited assistance   Dressing Assistance: Limited assistance     Functional Limitations Info             SPECIAL CARE FACTORS FREQUENCY  PT (By licensed PT)     PT Frequency: 5x a week              Contractures      Additional Factors Info  Code Status, Allergies Code Status Info: DNR Allergies Info: CODEINE, DEMEROL, VIOXX           Current Medications (10/19/2015):  This is the current hospital active medication list Current Facility-Administered Medications  Medication Dose Route Frequency Provider Last Rate Last Dose  . 0.9 %  sodium chloride infusion   Intravenous  Continuous Clydie Braun, MD 50 mL/hr at 10/17/15 0039    . acetaminophen (TYLENOL) tablet 500 mg  500 mg Oral BID Clydie Braun, MD   500 mg at 10/19/15 0943  . amLODipine (NORVASC) tablet 5 mg  5 mg Oral Daily Starleen Arms, MD   5 mg at 10/19/15 1716  . antiseptic oral rinse (CPC / CETYLPYRIDINIUM CHLORIDE 0.05%) solution 7 mL  7 mL Mouth Rinse q12n4p Starleen Arms, MD   7 mL at 10/19/15 1716  . [START ON 10/20/2015] apixaban (ELIQUIS) tablet 2.5 mg  2.5 mg Oral BID Leana Roe Elgergawy, MD      . atorvastatin (LIPITOR) tablet 20 mg  20 mg Oral q1800 Starleen Arms, MD   20 mg at 10/19/15 1716  . chlorhexidine (PERIDEX) 0.12 % solution 15 mL  15 mL Mouth Rinse BID Starleen Arms, MD   15 mL at 10/19/15 0943  . haloperidol lactate (HALDOL) injection 1 mg  1 mg Intravenous Q6H PRN Starleen Arms, MD   1 mg at 10/18/15 1120  . heparin injection 5,000 Units  5,000 Units Subcutaneous 3 times per day Starleen Arms, MD   5,000 Units at 10/19/15 1402  . levothyroxine (SYNTHROID, LEVOTHROID) tablet 62.5 mcg  62.5 mcg Oral QAC breakfast Clydie Braun, MD   62.5 mcg at 10/19/15 0831  .  RESOURCE THICKENUP CLEAR   Oral PRN Starleen Armsawood S Elgergawy, MD      . senna-docusate (Senokot-S) tablet 1 tablet  1 tablet Oral QHS PRN Clydie Braunondell A Smith, MD         Discharge Medications: Please see discharge summary for a list of discharge medications.  Relevant Imaging Results:  Relevant Lab Results:   Additional Information SSN [id number]  Contact percautions  Crespin Forstrom, Ervin Knackric R, LCSWA

## 2015-10-20 DIAGNOSIS — E785 Hyperlipidemia, unspecified: Secondary | ICD-10-CM | POA: Insufficient documentation

## 2015-10-20 LAB — HEMOGLOBIN A1C
Hgb A1c MFr Bld: 6.1 % — ABNORMAL HIGH (ref 4.8–5.6)
Mean Plasma Glucose: 128 mg/dL

## 2015-10-20 MED ORDER — ATORVASTATIN CALCIUM 20 MG PO TABS: 20.0000 mg | ORAL_TABLET | Freq: Every day | ORAL | Status: AC

## 2015-10-20 MED ORDER — APIXABAN 2.5 MG PO TABS: 2.5000 mg | ORAL_TABLET | Freq: Two times a day (BID) | ORAL | Status: AC

## 2015-10-20 MED ORDER — AMLODIPINE BESYLATE 10 MG PO TABS: 10.0000 mg | ORAL_TABLET | Freq: Every day | ORAL | Status: AC

## 2015-10-20 MED ORDER — RESOURCE THICKENUP CLEAR PO POWD: ORAL | Status: DC

## 2015-10-20 MED ORDER — SENNOSIDES-DOCUSATE SODIUM 8.6-50 MG PO TABS: 1.0000 | ORAL_TABLET | Freq: Every evening | ORAL | Status: DC | PRN

## 2015-10-20 MED ORDER — LEVOTHYROXINE SODIUM 125 MCG PO TABS: 62.5000 ug | ORAL_TABLET | Freq: Every day | ORAL | Status: AC

## 2015-10-20 MED ORDER — AMLODIPINE BESYLATE 10 MG PO TABS: 10.0000 mg | ORAL_TABLET | Freq: Every day | ORAL | Status: DC

## 2015-10-20 NOTE — Progress Notes (Signed)
Speech Language Pathology Treatment: Dysphagia  Patient Details Name: Jordan Ayala SettingBernice Kemnitz MRN: [id number] DOB: 1911-08-24 Today's Date: 10/20/2015 Time: [id number] SLP Time Calculation (min) (ACUTE ONLY): 15 min  Assessment / Plan / Recommendation Clinical Impression  F/u for dysphagia: pt presents with continued sensory deficits with oral retention/leakage with all POs.  Swallow is delayed with pt holding material orally for up to thirty seconds; when swallow is triggered, nectar-thick liquids do not elicit a cough response.  Thin liquids elicit immediate coughing, concerning for aspiration.  Pt is scheduled for D/C to SNF today.  Recommend advancing diet to dysphagia 2/nectar-thick liquids; recommend SLP f/u at SNF to address dysphagia, safety with POs, and diet advancement.     HPI HPI: Ms. Jeraldine LootsLockwood is [id number] year old female with past medical history of hypothyroidism and dementia; who [facility] with left-sided weakness. Patient was last seen normal between the times of 1641 - 1800 walking around her room. Then there is report that around 1900 patient wasfound slumped over in a chair not moving her left side. Patient noted to have slurred speech with a left facial droop and unable to move left side. MRI shows Acute infarct right insula and parietal operculum. No further history in chart.       SLP Plan  Continue with current plan of care     Recommendations  Diet recommendations: Dysphagia 2 (fine chop);Nectar-thick liquid Liquids provided via: Cup Medication Administration: Crushed with puree Supervision: Patient able to self feed Compensations: Minimize environmental distractions;Slow rate;Small sips/bites Postural Changes and/or Swallow Maneuvers: Seated upright 90 degrees;Upright 30-60 min after meal              Oral Care Recommendations: Oral care BID Follow up Recommendations: Skilled Nursing facility Plan: Continue with current plan of care    Blenda MountsCouture, Lakeyshia Tuckerman Laurice 10/20/2015, 12:43 PM

## 2015-10-20 NOTE — Progress Notes (Signed)
Physical Therapy Treatment Patient Details Name: Jordan Ayala MRN: [id number] DOB: 1911-10-02 Today's Date: 10/20/2015    History of Present Illness Jordan Ayala is a48 year old female with past medical history of hypothyroidism and dementia; who presents from [facility] with left-sided weakness. MRI: Acute infarct right insula and parietal operculum    PT Comments    Pt is progressing towards physical therapy goals. Was able to transfer with increased independence this session however continues to require assistance for safety and balance. Pt will benefit from further skilled therapy at the SNF level at d/c. Will continue to follow.   Follow Up Recommendations  SNF;Supervision/Assistance - 24 hour     Equipment Recommendations  None recommended by PT    Recommendations for Other Services       Precautions / Restrictions Precautions Precautions: Fall Precaution Comments: left side inattention Restrictions Weight Bearing Restrictions: No    Mobility  Bed Mobility Overal bed mobility: Needs Assistance Bed Mobility: Supine to Sit     Supine to sit: Min assist     General bed mobility comments: Increased time and cues to tay on task. Pt was sleeping when PT arrived and required multiple cues to initiate bed mobility before she actually did.  Transfers Overall transfer level: Needs assistance Equipment used: 1 person hand held assist Transfers: Sit to/from [employer] Sit to Stand: Min assist Stand pivot transfers: Min assist;+2 safety/equipment       General transfer comment: Pt required assist to power-up to full standing position. VC's for hand placement on bedside commode for support as pt took pivotal steps around to sit.   Ambulation/Gait Ambulation/Gait assistance: Min assist Ambulation Distance (Feet): 5 Feet Assistive device: 1 person hand held assist Gait Pattern/deviations: Step-through pattern;Decreased stride  length;Trunk flexed Gait velocity: Decreased Gait velocity interpretation: Below normal speed for age/gender General Gait Details: Pt took a few pivotal steps from bed to [facility], and then pt walked a few more feet from [facility] to the recliner in room. Feel pt could have tolerated more ambulation, however focus of session was use of BSC and transition to chair.    Stairs            Wheelchair Mobility    Modified Rankin (Stroke Patients Only)       Balance Overall balance assessment: Needs assistance Sitting-balance support: Feet supported;No upper extremity supported Sitting balance-Leahy Scale: Fair     Standing balance support: Bilateral upper extremity supported;During functional activity Standing balance-Leahy Scale: Poor                      Cognition Arousal/Alertness: Awake/alert Behavior During Therapy: WFL for tasks assessed/performed Overall Cognitive Status: Impaired/Different from baseline Area of Impairment: Attention;Following commands;Safety/judgement;Problem solving   Current Attention Level: Selective Memory: Decreased short-term memory Following Commands: Follows one step commands with increased time Safety/Judgement: Decreased awareness of safety;Decreased awareness of deficits   Problem Solving: Slow processing;Decreased initiation;Difficulty sequencing;Requires verbal cues;Requires tactile cues General Comments: easily distracted    Exercises      General Comments        Pertinent Vitals/Pain Pain Assessment: Faces Faces Pain Scale: No hurt    Home Living                      Prior Function            PT Goals (current goals can now be found in the care plan section) Acute Rehab PT Goals PT  Goal Formulation: Patient unable to participate in goal setting Time For Goal Achievement: 10/31/15 Potential to Achieve Goals: Fair Progress towards PT goals: Progressing toward goals    Frequency  Min 3X/week    PT Plan  Current plan remains appropriate    Co-evaluation PT/OT/SLP Co-Evaluation/Treatment: Yes Reason for Co-Treatment: For patient/therapist safety PT goals addressed during session: Mobility/safety with mobility;Balance       End of Session Equipment Utilized During Treatment: Gait belt Activity Tolerance: Patient tolerated treatment well Patient left: in chair;with chair alarm set;with call bell/phone within reach     Time: [id number] PT Time Calculation (min) (ACUTE ONLY): 22 min  Charges:  $Gait Training: 8-22 mins                    G Codes:      Conni SlipperKirkman, Destani Wamser 10/20/2015, 12:56 PM   Conni SlipperLaura Markayla Reichart, PT, DPT Acute Rehabilitation Services Pager: [phone]

## 2015-10-20 NOTE — Clinical Social Work Placement (Signed)
   CLINICAL SOCIAL WORK PLACEMENT  NOTE  Date:  10/20/2015  Patient Details  Name: Jordan Ayala MRN: [id number] Date of Birth: [birth year]  Clinical Social Work is seeking post-discharge placement for this patient at the Skilled  Nursing Facility level of care (*CSW will initial, date and re-position this form in  chart as items are completed):  Yes   Patient/family provided with [facility] Clinical Social Work Department's list of facilities offering this level of care within the geographic area requested by the patient (or if unable, by the patient's family).  Yes   Patient/family informed of their freedom to choose among providers that offer the needed level of care, that participate in Medicare, Medicaid or managed care program needed by the patient, have an available bed and are willing to accept the patient.  Yes   Patient/family informed of [facility]'s ownership interest in [facility] and [facility], as well as of the fact that they are under no obligation to receive care at these facilities.  PASRR submitted to EDS on 10/19/15     PASRR number received on       Existing PASRR number confirmed on 10/19/15     FL2 transmitted to all facilities in geographic area requested by pt/family on 10/19/15     FL2 transmitted to all facilities within larger geographic area on 10/19/15     Patient informed that his/her managed care company has contracts with or will negotiate with certain facilities, including the following:        Yes   Patient/family informed of bed offers received.  [facility]     Physician recommends and patient chooses bed at      [facility].  Patient to be transferred to facility by PTAR     Patient family notified on 10/20/15 of transfer.  Name of family member notified:  Niece, Corrie DandyMary     PHYSICIAN Please sign FL2, Please sign DNR     Additional Comment:     _______________________________________________ Rod MaeVaughn, Shauni Henner S, LCSW 10/20/2015, 1:20 PM

## 2015-10-20 NOTE — Progress Notes (Signed)
Patient is discharged from room 5C12 at this time. Alert and instable condition. IV site and tele d/c'd. Report called in to receiving nurse/supervisor Bonita QuinLinda [facility]. Patient transported out of unit via stretcher by PTAR with family and all belongings at side.

## 2015-10-20 NOTE — Discharge Summary (Signed)
Jordan Ayala, is a 81 y.o. female  DOB 06/01/1911  MRN [id number].  Admission date:  10/16/2015  Admitting Physician  Clydie Braun, MD  Discharge Date:  10/20/2015   Primary MD  No primary care provider on file.  Recommendations for primary care physician for things to follow:  - Please check CBC, BMP in 3 days from discharge. - To be seen and evaluated by SLP prior to advancing diet, he is currently on dysphagia 1 with honey thick  CODE STATUS: DO NOT RESUSCITATE Admission Diagnosis  Acute right MCA stroke (HCC) [I63.511] Atrial fibrillation, unspecified type [facility]) [I48.91]   Discharge Diagnosis  Acute right MCA stroke (HCC) [I63.511] Atrial fibrillation, unspecified type (HCC) [I48.91]   Principal Problem:   Acute right MCA stroke [facility]) Active Problems:   Hypothyroidism   Atrial fibrillation (HCC)   HLD (hyperlipidemia)      Past Medical History  Diagnosis Date  . Hyperthyroidism   . Hyperthyroidism     History reviewed. No pertinent past surgical history.     History of present illness and  Hospital Course:     Kindly see H&P for history of present illness and admission details, please review complete Labs, Consult reports and Test reports for all details in brief  HPI  from the history and physical done on the day of admission Jordan Ayala is a74 year old female with past medical history of hypothyroidism and dementia; who presents from [facility] with left-sided weakness. Patient was last seen normal between the times of 1641 - 1800 walking around her room. Then there is report that around 1900 patient wasfound slumped over in a chair not moving her left side. Patient noted to have slurred speech with a left facial droop and unable to move left side. Upon arrival code stroke was called and initial CT scan showed no acute abnormalities.   Hospital  Course   Admission history of present illness/brief narrative: a8 year old female with past medical history of hypothyroidism and dementia; who presents from [facility] with left-sided weakness, Significant for acute right MCA stroke.  Acute right MCA stroke  - With residual left-sided weakness, dysphagia,  felt secondary to embolic etiology given she found to be in A. Fib. - MRI brain : Acute infarct right insula and parietal operculum - MRA head: Ocean degraded, Atherosclerosis in middle cerebral arteries right> left - 2-D echo: EF 55-60%, no cardiac source of embolic identified. - LDL 102, started on simvastatin - Neurology input appreciated , Due to high functioning . Admission without contraindication, neurology recommend anticoagulation with NOAC , patient started on Eliquis, plan  Was discussed with niece, and an agreeable.  Hypothyroidism - Continue with levothyroxine  New onset A. Fib - new diagnoses, Chads2Vasc 2 score of 5,  - Patient in A. fib with slow ventricular response, not in any blocking agents, asymptomatic. - started on Eliquis  Dementia - Continue with supportive care  Hypertension - Started on amlodipine, retracting dose up gradually to allow for permissive hypertension,  will discharge on 10 mg oral daily  Hyperlipidemia - LDL is 102, started on Lipitor     Discharge Condition:  Stable   Follow UP  Follow-up Information    Follow up with Xu,Jindong, MD. Schedule an appointment as soon as possible for a visit in 2 months.   Specialty:  Neurology   Why:  stroke clinic   Contact information:   [address] [place] [address] [phone]         Discharge Instructions  and  Discharge Medications         Discharge Instructions    Ambulatory referral to Neurology    Complete by:  As directed   Pt will follow up with Dr. Roda Shutters at [facility] in about 2 months. Thanks.     Discharge instructions    Complete  by:  As directed   Follow with Primary MDin 7 days   Get CBC, CMP, 2 view Chest X ray checked  by Primary MD next visit.    Activity: As tolerated with Full fall precautions use walker/cane & assistance as needed   Disposition SNF   Diet: Heart Healthy , dysphagia 1 with honey thick , with feeding assistance and aspiration precautions.  For Heart failure patients - Check your Weight same time everyday, if you gain over 2 pounds, or you develop in leg swelling, experience more shortness of breath or chest pain, call your Primary MD immediately. Follow Cardiac Low Salt Diet and 1.5 lit/day fluid restriction.   On your next visit with your primary care physician please Get Medicines reviewed and adjusted.   Please request your Prim.MD to go over all Hospital Tests and Procedure/Radiological results at the follow up, please get all Hospital records sent to your Prim MD by signing hospital release before you go home.   If you experience worsening of your admission symptoms, develop shortness of breath, life threatening emergency, suicidal or homicidal thoughts you must seek medical attention immediately by calling 911 or calling your MD immediately  if symptoms less severe.  You Must read complete instructions/literature along with all the possible adverse reactions/side effects for all the Medicines you take and that have been prescribed to you. Take any new Medicines after you have completely understood and accpet all the possible adverse reactions/side effects.   Do not drive, operating heavy machinery, perform activities at heights, swimming or participation in water activities or provide baby sitting services if your were admitted for syncope or siezures until you have seen by Primary MD or a Neurologist and advised to do so again.  Do not drive when taking Pain medications.    Do not take more than prescribed Pain, Sleep and Anxiety Medications  Special Instructions: If you have  smoked or chewed Tobacco  in the last 2 yrs please stop smoking, stop any regular Alcohol  and or any Recreational drug use.  Wear Seat belts while driving.   Please note  You were cared for by a hospitalist during your hospital stay. If you have any questions about your discharge medications or the care you received while you were in the hospital after you are discharged, you can call the unit and asked to speak with the hospitalist on call if the hospitalist that took care of you is not available. Once you are discharged, your primary care physician will handle any further medical issues. Please note that NO REFILLS for any discharge medications will be authorized once you are discharged, as it  is imperative that you return to your primary care physician (or establish a relationship with a primary care physician if you do not have one) for your aftercare needs so that they can reassess your need for medications and monitor your lab values.     Increase activity slowly    Complete by:  As directed             Medication List    TAKE these medications        acetaminophen 500 MG tablet  Commonly known as:  TYLENOL  Take 500 mg by mouth 2 (two) times daily.     amLODipine 10 MG tablet  Commonly known as:  NORVASC  Take 1 tablet (10 mg total) by mouth daily.  Start taking on:  10/21/2015     apixaban 2.5 MG Tabs tablet  Commonly known as:  ELIQUIS  Take 1 tablet (2.5 mg total) by mouth 2 (two) times daily.     atorvastatin 20 MG tablet  Commonly known as:  LIPITOR  Take 1 tablet (20 mg total) by mouth daily at 6 PM.     levothyroxine 125 MCG tablet  Commonly known as:  SYNTHROID, LEVOTHROID  Take 0.5 tablets (62.5 mcg total) by mouth daily before breakfast.     RESOURCE THICKENUP CLEAR Powd  With all fluids     senna-docusate 8.6-50 MG tablet  Commonly known as:  Senokot-S  Take 1 tablet by mouth at bedtime as needed for mild constipation.          Diet and Activity  recommendation: See Discharge Instructions above   Consults obtained -  Neurology   Major procedures and Radiology Reports - PLEASE review detailed and final reports for all details, in brief -      Dg Eye Foreign Body  10/17/2015  CLINICAL DATA:  Metal working/exposure; clearance prior to MRI EXAM: ORBITS FOR FOREIGN BODY - 2 VIEW COMPARISON:  None. FINDINGS: There is no evidence of metallic foreign body within the orbits. No significant bone abnormality identified. IMPRESSION: No evidence of metallic foreign body within the orbits. Electronically Signed   By: Sherian Rein M.D.   On: 10/17/2015 09:26   Dg Chest 2 View  10/17/2015  CLINICAL DATA:  Stroke EXAM: CHEST  2 VIEW COMPARISON:  None. FINDINGS: Stable enlarged cardiac silhouette. No effusion, infiltrate, pneumothorax. No acute osseous abnormality. Calcified hilar lymph node. IMPRESSION: Cardiomegaly without acute cardiopulmonary findings. Cannot exclude pericardial effusion. Electronically Signed   By: Genevive Bi M.D.   On: 10/17/2015 08:45   Dg Abd 1 View  10/17/2015  CLINICAL DATA:  Stroke EXAM: ABDOMEN - 1 VIEW COMPARISON:  None FINDINGS: There is a moderate stool burden identified throughout the colon. No dilated small bowel loops. No abnormal bowel dilatation. IMPRESSION: Moderate stool burden in the colon.  No obstruction. Electronically Signed   By: Signa Kell M.D.   On: 10/17/2015 09:38   Ct Head Wo Contrast  10/16/2015  CLINICAL DATA:  81 year old female with code stroke. Left facial droop and left-sided weakness. EXAM: CT HEAD WITHOUT CONTRAST TECHNIQUE: Contiguous axial images were obtained from the base of the skull through the vertex without intravenous contrast. COMPARISON:  None. FINDINGS: The ventricles are dilated and the sulci are prominent compatible with age-related atrophy. Periventricular and deep white matter hypodensities represent chronic microvascular ischemic changes. There is no intracranial  hemorrhage. No mass effect or midline shift identified. The visualized paranasal sinuses and mastoid air cells are well aerated. The  calvarium is intact. IMPRESSION: No acute intracranial hemorrhage. Age-related atrophy and chronic microvascular ischemic disease. If symptoms persist and there are no contraindications, MRI may provide better evaluation if clinically indicated. These results were called by telephone at the time of interpretation on 10/16/2015 at 7:53 pm to Dr. Hosie PoissonSumner, who verbally acknowledged these results. Electronically Signed   By: Elgie CollardArash  Radparvar M.D.   On: 10/16/2015 19:55   Mr Brain Wo Contrast  10/17/2015  CLINICAL DATA:  Left-sided weakness.  Stroke. EXAM: MRI HEAD WITHOUT CONTRAST MRA HEAD WITHOUT CONTRAST TECHNIQUE: Multiplanar, multiecho pulse sequences of the brain and surrounding structures were obtained without intravenous contrast. Angiographic images of the head were obtained using MRA technique without contrast. COMPARISON:  CT head 10/16/2015 FINDINGS: MRI HEAD FINDINGS Acute infarct involving the right posterior insula and parietal operculum. No other areas of acute infarct. Advanced atrophy with prominent ventricles and subarachnoid space diffusely. Chronic microvascular ischemic change in the white matter. Mild chronic ischemia in the pons. Negative for hemorrhage or fluid collection Negative for mass or edema.  No shift of the midline structures Normal pituitary.  Paranasal sinuses clear. MRA HEAD FINDINGS Image quality degraded by motion. Small right vertebral artery is hypoplastic and possibly disease. Small contribution to the basilar. Distal left vertebral artery patent to the basilar. Basilar is irregular compatible with mild atherosclerotic disease. Fetal origin right posterior cerebral artery with hypoplastic right P1 segment. Both posterior cerebral arteries are patent. Internal carotid artery patent bilaterally. Anterior cerebral arteries patent bilaterally. M1  segment patent bilaterally. Decreased signal in the middle cerebral artery branches bilaterally right greater than left. Probable moderate stenosis of the right MCA bifurcation. Bilateral MCA atherosclerotic disease No aneurysm identified IMPRESSION: Acute infarct right insula and parietal operculum Advanced atrophy. Chronic microvascular ischemic change in the white matter MRA degraded by motion. Decreased signal middle cerebral arteries bilaterally right greater than left consistent with atherosclerotic disease. Electronically Signed   By: Marlan Palauharles  Clark M.D.   On: 10/17/2015 08:21   Mr Maxine GlennMra Head/brain Wo Cm  10/17/2015  CLINICAL DATA:  Left-sided weakness.  Stroke. EXAM: MRI HEAD WITHOUT CONTRAST MRA HEAD WITHOUT CONTRAST TECHNIQUE: Multiplanar, multiecho pulse sequences of the brain and surrounding structures were obtained without intravenous contrast. Angiographic images of the head were obtained using MRA technique without contrast. COMPARISON:  CT head 10/16/2015 FINDINGS: MRI HEAD FINDINGS Acute infarct involving the right posterior insula and parietal operculum. No other areas of acute infarct. Advanced atrophy with prominent ventricles and subarachnoid space diffusely. Chronic microvascular ischemic change in the white matter. Mild chronic ischemia in the pons. Negative for hemorrhage or fluid collection Negative for mass or edema.  No shift of the midline structures Normal pituitary.  Paranasal sinuses clear. MRA HEAD FINDINGS Image quality degraded by motion. Small right vertebral artery is hypoplastic and possibly disease. Small contribution to the basilar. Distal left vertebral artery patent to the basilar. Basilar is irregular compatible with mild atherosclerotic disease. Fetal origin right posterior cerebral artery with hypoplastic right P1 segment. Both posterior cerebral arteries are patent. Internal carotid artery patent bilaterally. Anterior cerebral arteries patent bilaterally. M1 segment  patent bilaterally. Decreased signal in the middle cerebral artery branches bilaterally right greater than left. Probable moderate stenosis of the right MCA bifurcation. Bilateral MCA atherosclerotic disease No aneurysm identified IMPRESSION: Acute infarct right insula and parietal operculum Advanced atrophy. Chronic microvascular ischemic change in the white matter MRA degraded by motion. Decreased signal middle cerebral arteries bilaterally right greater than left consistent with  atherosclerotic disease. Electronically Signed   By: Marlan Palau M.D.   On: 10/17/2015 08:21    Micro Results    Recent Results (from the past 240 hour(s))  MRSA PCR Screening     Status: Abnormal   Collection Time: 10/17/15  3:34 AM  Result Value Ref Range Status   MRSA by PCR POSITIVE (A) NEGATIVE Final    Comment:        The GeneXpert MRSA Assay (FDA approved for NASAL specimens only), is one component of a comprehensive MRSA colonization surveillance program. It is not intended to diagnose MRSA infection nor to guide or monitor treatment for MRSA infections. RESULT CALLED TO, READ BACK BY AND VERIFIED WITH: RICH  10/17/15 MKELLY        Today   Subjective:   [place] Setting today has, No headache, No chest pain, No abdominal pain - No Nausea, No Cough - SOB  Objective:   Blood pressure 153/66, pulse 58, temperature 98.2 F (36.8 C), temperature source Oral, resp. rate 18, height  (1.626 m), weight 63.504 kg (140 lb), SpO2 97 %.   Intake/Output Summary (Last 24 hours) at 10/20/15 1123 Last data filed at 10/20/15 0159  Gross per 24 hour  Intake    240 ml  Output      0 ml  Net    240 ml    Exam  Awake Alert, pleasant, confused Supple Neck,No JVD, Symmetrical Chest wall movement, Good air movement bilaterally, CTAB No Gallops,Rubs or new Murmurs, No Parasternal Heave +ve B.Sounds, Abd Soft, No tenderness, No organomegaly appriciated, No rebound - guarding or  rigidity. No Cyanosis, Clubbing or edema, Some left lower extremity weakness.  Data Review   CBC w Diff:  Lab Results  Component Value Date   WBC 6.6 10/16/2015   HGB 15.3* 10/16/2015   HCT 45.0 10/16/2015   PLT 161 10/16/2015   LYMPHOPCT 35 10/16/2015   MONOPCT 5 10/16/2015   EOSPCT 2 10/16/2015   BASOPCT 0 10/16/2015    CMP:  Lab Results  Component Value Date   NA 140 10/16/2015   K 4.1 10/16/2015   CL 105 10/16/2015   CO2 23 10/16/2015   BUN 27* 10/16/2015   CREATININE 0.80 10/16/2015   PROT 6.4* 10/16/2015   ALBUMIN 3.6 10/16/2015   BILITOT 1.1 10/16/2015   ALKPHOS 75 10/16/2015   AST 27 10/16/2015   ALT 20 10/16/2015  .   Total Time in preparing paper work, data evaluation and todays exam - 35 minutes  Jaran Sainz M.D on 10/20/2015 at 11:23 AM  [facility]   Office  [phone]

## 2015-10-20 NOTE — Progress Notes (Signed)
Utilization Review Completed.Bexton Haak T12/27/2016  

## 2015-10-20 NOTE — Progress Notes (Signed)
Occupational Therapy Treatment Patient Details Name: Jordan Ayala MRN: [id number] DOB: Mar 19, 1911 Today's Date: 10/20/2015    History of present illness Jordan Ayala is a81 year old female with past medical history of hypothyroidism and dementia; who presents from [facility] with left-sided weakness. MRI: Acute infarct right insula and parietal operculum   OT comments  Pt progressing towards occupational therapy goals. Pt completed transfers with increased independence this session, but still required min +2 assist for safety, balance, and some impulsivity. Recommend SNF for post-acute rehab to increase independence and safety with ADLs and mobility. Will continue to follow acutely.   Follow Up Recommendations  SNF    Equipment Recommendations  None recommended by OT    Recommendations for Other Services      Precautions / Restrictions Precautions Precautions: Fall Precaution Comments: left side inattention Restrictions Weight Bearing Restrictions: No       Mobility Bed Mobility Overal bed mobility: Needs Assistance Bed Mobility: Supine to Sit     Supine to sit: Min assist     General bed mobility comments: HOB flat, use of bedrails. Increased time and cues to tay on task. Pt was sleeping on OT arrival and required multiple cues to initiate bed mobility before she actually did.  Transfers Overall transfer level: Needs assistance Equipment used: 1 person hand held assist Transfers: Sit to/from Stand Sit to Stand: Min assist Stand pivot transfers: Min assist;+2 safety/equipment       General transfer comment: Pt required assist to power-up to full standing position. VC's for hand placement on bedside commode for support as pt took pivotal steps around to sit.     Balance Overall balance assessment: Needs assistance Sitting-balance support: No upper extremity supported;Feet supported Sitting balance-Leahy Scale: Fair     Standing balance  support: Bilateral upper extremity supported;During functional activity Standing balance-Leahy Scale: Poor                     ADL Overall ADL's : Needs assistance/impaired Eating/Feeding: Set up;Sitting   Grooming: Wash/dry hands;Wash/dry face;Brushing hair;Set up;Sitting                   Toilet Transfer: Minimal assistance;+2 for safety/equipment;Stand-pivot;BSC   Toileting- Clothing Manipulation and Hygiene: Moderate assistance;+2 for safety/equipment;Cueing for sequencing;Sit to/from stand       Functional mobility during ADLs: Minimal assistance General ADL Comments: Min-mod assist for ADLs and mobility throughout session. Laid grooming task objects out on table and asked pt to locate items on L side - pt able to locate 4/4 objects.       Vision                     Perception     Praxis      Cognition   Behavior During Therapy: [facility] for tasks assessed/performed Overall Cognitive Status: Impaired/Different from baseline Area of Impairment: Attention;Following commands;Safety/judgement;Problem solving   Current Attention Level: Selective Memory: Decreased short-term memory  Following Commands: Follows one step commands with increased time Safety/Judgement: Decreased awareness of safety;Decreased awareness of deficits   Problem Solving: Slow processing;Decreased initiation;Difficulty sequencing;Requires verbal cues;Requires tactile cues General Comments: easily distracted    Extremity/Trunk Assessment               Exercises     Shoulder Instructions       General Comments      Pertinent Vitals/ Pain       Pain Assessment: Faces Faces Pain Scale: No hurt  Home Living                                          Prior Functioning/Environment              Frequency Min 2X/week     Progress Toward Goals  OT Goals(current goals can now be found in the care plan section)  Progress towards OT goals:  Progressing toward goals  Acute Rehab OT Goals Patient Stated Goal: none stated OT Goal Formulation: With patient Time For Goal Achievement: 10/24/15 Potential to Achieve Goals: Good ADL Goals Pt Will Perform Grooming: with min assist;sitting Pt Will Transfer to Toilet: with min assist;stand pivot transfer;bedside commode Pt Will Perform Toileting - Clothing Manipulation and hygiene:  (Pt will be able to stand with min A for these tasks) Additional ADL Goal #1: Pt will be min A in and OOB for basic ADLs Additional ADL Goal #2: Pt will be able to locate items on her left with min cues Additional ADL Goal #3: Pt will be able to tolerate 5-10 minutes of Bil UE/LUE activities to work on increased use of LUE  Plan Discharge plan remains appropriate    Co-evaluation    PT/OT/SLP Co-Evaluation/Treatment: Yes Reason for Co-Treatment: For patient/therapist safety PT goals addressed during session: Mobility/safety with mobility;Balance OT goals addressed during session: ADL's and self-care      End of Session Equipment Utilized During Treatment: Gait belt   Activity Tolerance Patient tolerated treatment well   Patient Left in chair;with call bell/phone within reach;with chair alarm set   Nurse Communication Mobility status        Time: [id number] OT Time Calculation (min): 26 min  Charges: OT General Charges $OT Visit: 1 Procedure OT Treatments $Self Care/Home Management : 8-22 mins  Jordan PyleJulia Christyann Ayala, OTR/L 10/20/2015, 1:26 PM Pager: [phone]

## 2015-10-20 NOTE — Discharge Instructions (Signed)
Follow with Primary MDin 7 days   Get CBC, CMP, 2 view Chest X ray checked  by Primary MD next visit.    Activity: As tolerated with Full fall precautions use walker/cane & assistance as needed   Disposition SNF   Diet: Heart Healthy , dysphagia 1 with honey thick , with feeding assistance and aspiration precautions.  For Heart failure patients - Check your Weight same time everyday, if you gain over 2 pounds, or you develop in leg swelling, experience more shortness of breath or chest pain, call your Primary MD immediately. Follow Cardiac Low Salt Diet and 1.5 lit/day fluid restriction.   On your next visit with your primary care physician please Get Medicines reviewed and adjusted.   Please request your Prim.MD to go over all Hospital Tests and Procedure/Radiological results at the follow up, please get all Hospital records sent to your Prim MD by signing hospital release before you go home.   If you experience worsening of your admission symptoms, develop shortness of breath, life threatening emergency, suicidal or homicidal thoughts you must seek medical attention immediately by calling 911 or calling your MD immediately  if symptoms less severe.  You Must read complete instructions/literature along with all the possible adverse reactions/side effects for all the Medicines you take and that have been prescribed to you. Take any new Medicines after you have completely understood and accpet all the possible adverse reactions/side effects.   Do not drive, operating heavy machinery, perform activities at heights, swimming or participation in water activities or provide baby sitting services if your were admitted for syncope or siezures until you have seen by Primary MD or a Neurologist and advised to do so again.  Do not drive when taking Pain medications.    Do not take more than prescribed Pain, Sleep and Anxiety Medications  Special Instructions: If you have smoked or chewed  Tobacco  in the last 2 yrs please stop smoking, stop any regular Alcohol  and or any Recreational drug use.  Wear Seat belts while driving.   Please note  You were cared for by a hospitalist during your hospital stay. If you have any questions about your discharge medications or the care you received while you were in the hospital after you are discharged, you can call the unit and asked to speak with the hospitalist on call if the hospitalist that took care of you is not available. Once you are discharged, your primary care physician will handle any further medical issues. Please note that NO REFILLS for any discharge medications will be authorized once you are discharged, as it is imperative that you return to your primary care physician (or establish a relationship with a primary care physician if you do not have one) for your aftercare needs so that they can reassess your need for medications and monitor your lab values.  Information on my medicine - ELIQUIS (apixaban)  This medication education was reviewed with me or my healthcare representative as part of my discharge preparation.  The pharmacist that spoke with me during my hospital stay was:  Sampson SiMancheril, Shalan Neault G, [facility]  Why was Eliquis prescribed for you? Eliquis was prescribed for you to reduce the risk of a blood clot forming that can cause a stroke if you have a medical condition called atrial fibrillation (a type of irregular heartbeat).  What do You need to know about Eliquis ? Take your Eliquis TWICE DAILY - one tablet in the morning and one tablet in  the evening with or without food. If you have difficulty swallowing the tablet whole please discuss with your pharmacist how to take the medication safely.  Take Eliquis exactly as prescribed by your doctor and DO NOT stop taking Eliquis without talking to the doctor who prescribed the medication.  Stopping may increase your risk of developing a stroke.  Refill your prescription  before you run out.  After discharge, you should have regular check-up appointments with your healthcare provider that is prescribing your Eliquis.  In the future your dose may need to be changed if your kidney function or weight changes by a significant amount or as you get older.  What do you do if you miss a dose? If you miss a dose, take it as soon as you remember on the same day and resume taking twice daily.  Do not take more than one dose of ELIQUIS at the same time to make up a missed dose.  Important Safety Information A possible side effect of Eliquis is bleeding. You should call your healthcare provider right away if you experience any of the following: ? Bleeding from an injury or your nose that does not stop. ? Unusual colored urine (red or dark brown) or unusual colored stools (red or black). ? Unusual bruising for unknown reasons. ? A serious fall or if you hit your head (even if there is no bleeding).  Some medicines may interact with Eliquis and might increase your risk of bleeding or clotting while on Eliquis. To help avoid this, consult your healthcare provider or pharmacist prior to using any new prescription or non-prescription medications, including herbals, vitamins, non-steroidal anti-inflammatory drugs (NSAIDs) and supplements.  This website has more information on Eliquis (apixaban): http://www.eliquis.com/eliquis/home

## 2015-10-20 NOTE — Clinical Social Work Note (Addendum)
Clinical Social Work Assessment  Patient Details  Name: Jordan Ayala Bess MRN: [id number] Date of Birth: [birth year]  Date of referral:  10/19/15               Reason for consult:  Facility Placement                Permission sought to share information with:  Facility Medical sales representativeContact Representative Permission granted to share information::  Yes, Release of Information Signed  Name::     Ernest HaberMary Dimmick Niece [phone] or [phone]  Agency::  SNF admissions  Relationship::     Contact Information:     Housing/Transportation Living arrangements for the past 2 months:  Assisted Living Facility Source of Information:  Other (Comment Required) (Niece and nephew) Patient Interpreter Needed:  None Criminal Activity/Legal Involvement Pertinent to Current Situation/Hospitalization:  No - Comment as needed Significant Relationships:  Other Family Members Lives with:  Facility Resident Do you feel safe going back to the place where you live?  Yes (Patient's family feels she needs some short term rehab first before returning back to the ALF) Need for family participation in patient care:  Yes (Comment) (Patient has some dementia)  Care giving concerns: Patient's family would like patient to get some short term rehab before returning back to [facility]   Social Worker assessment / plan:  Patient is a 81 year old female who [facility].  Patient has some dementia, so information was gathered speaking to patient's family members.  Patient is a pleasantly confused, patient's family is in agreement to having patient go to SNF for some rehab first then going to ALF once he has received some therapy.  Patient's family expressed that patient will be retuning [facility] once the [facility].  Patient's family feels like she will need to go to a SNF for short term rehab.  Patient's family, would like  [place] [place] [facility] [place] if she is able to.  Patient's family was explained SNF search process and and CSW will working bed placement for patient.  The family expressed they did not have other questions.    Employment status:  Retired Database administratornsurance information:  Managed Medicare PT Recommendations:  Skilled Nursing Facility Information / Referral to community resources:  Skilled Nursing Facility  Patient/Family's Response to care:  Patient and family in agreement to going to SNF for short term rehab.  Patient/Family's Understanding of and Emotional Response to Diagnosis, Current Treatment, and Prognosis:  Patient and family are aware of current diagnosis and do not express any other concerns or issues.  Emotional Assessment Appearance:  Appears stated age Attitude/Demeanor/Rapport:    Affect (typically observed):  Appropriate, Hopeful Orientation:  Oriented to Self, Oriented to Place Alcohol / Substance use:  Not Applicable Psych involvement (Current and /or in the community):  No (Comment)  Discharge Needs  Concerns to be addressed:  No discharge needs identified Readmission within the last 30 days:  No Current discharge risk:  None Barriers to Discharge:  No Barriers Identified   Darleene Cleavernterhaus, Adan Baehr R, LCSWA 10/19/15, 12:00 PM

## 2015-10-20 NOTE — Clinical Social Work Placement (Signed)
   CLINICAL SOCIAL WORK PLACEMENT  NOTE  Date:  10/20/2015  Patient Details  Name: Benson SettingBernice Maddalena MRN: [id number] Date of Birth: 1911/09/06  Clinical Social Work is seeking post-discharge placement for this patient at the Skilled  Nursing Facility level of care (*CSW will initial, date and re-position this form in  chart as items are completed):  Yes   Patient/family provided with [facility] Clinical Social Work Department's list of facilities offering this level of care within the geographic area requested by the patient (or if unable, by the patient's family).  Yes   Patient/family informed of their freedom to choose among providers that offer the needed level of care, that participate in Medicare, Medicaid or managed care program needed by the patient, have an available bed and are willing to accept the patient.  Yes   Patient/family informed of [facility]'s ownership interest in [facility] and [facility], as well as of the fact that they are under no obligation to receive care at these facilities.  PASRR submitted to EDS on 10/19/15     PASRR number received on       Existing PASRR number confirmed on 10/19/15     FL2 transmitted to all facilities in geographic area requested by pt/family on 10/19/15     FL2 transmitted to all facilities within larger geographic area on 10/19/15     Patient informed that his/her managed care company has contracts with or will negotiate with certain facilities, including the following:            Patient/family informed of bed offers received.  Patient chooses bed at       Physician recommends and patient chooses bed at      Patient to be transferred to   on  .  Patient to be transferred to facility by       Patient family notified on   of transfer.  Name of family member notified:        PHYSICIAN Please sign FL2, Please sign DNR     Additional Comment:    _______________________________________________ Darleene CleaverAnterhaus,  Bobbie Virden R, LCSWA 10/19/15, 12:15 pm

## 2015-10-20 NOTE — Discharge Planning (Signed)
[facility] to [facility]. Patient's niece, Corrie DandyMary, updated.  [facility] RN report number: [phone] Transportation: [facility]  Rana Hochstein, [place] [phone] Orthopedics: [phone] Surgical: [phone]

## 2015-10-21 ENCOUNTER — Non-Acute Institutional Stay (SKILLED_NURSING_FACILITY): Payer: Medicare Other | Admitting: Internal Medicine

## 2015-10-21 DIAGNOSIS — R1310 Dysphagia, unspecified: Secondary | ICD-10-CM | POA: Diagnosis not present

## 2015-10-21 DIAGNOSIS — I480 Paroxysmal atrial fibrillation: Secondary | ICD-10-CM

## 2015-10-21 DIAGNOSIS — J3089 Other allergic rhinitis: Secondary | ICD-10-CM | POA: Diagnosis not present

## 2015-10-21 DIAGNOSIS — F0391 Unspecified dementia with behavioral disturbance: Secondary | ICD-10-CM | POA: Insufficient documentation

## 2015-10-21 DIAGNOSIS — F03918 Unspecified dementia, unspecified severity, with other behavioral disturbance: Secondary | ICD-10-CM | POA: Insufficient documentation

## 2015-10-21 DIAGNOSIS — R531 Weakness: Secondary | ICD-10-CM

## 2015-10-21 DIAGNOSIS — E039 Hypothyroidism, unspecified: Secondary | ICD-10-CM | POA: Diagnosis not present

## 2015-10-21 DIAGNOSIS — K5900 Constipation, unspecified: Secondary | ICD-10-CM | POA: Diagnosis not present

## 2015-10-21 DIAGNOSIS — E785 Hyperlipidemia, unspecified: Secondary | ICD-10-CM

## 2015-10-21 DIAGNOSIS — I63511 Cerebral infarction due to unspecified occlusion or stenosis of right middle cerebral artery: Secondary | ICD-10-CM | POA: Diagnosis not present

## 2015-10-21 DIAGNOSIS — I10 Essential (primary) hypertension: Secondary | ICD-10-CM | POA: Diagnosis not present

## 2015-10-21 NOTE — Progress Notes (Signed)
Patient ID: Jordan Ayala, female   DOB: [birth year], 81 y.o.   MRN: [id number]      [facility]  PCP: No primary care provider on file.  Code Status: DNR  Allergies  Allergen Reactions  . Codeine Other (See Comments)    Per MAR  . Demerol [Meperidine] Other (See Comments)    Per MAR  . Vioxx [Rofecoxib] Other (See Comments)    Per [facility]    Chief Complaint  Patient presents with  . New Admit To SNF     HPI:  81 y.o. patient is here for short term rehabilitation post hospital admission from 10/16/15-10/20/15 with acute right MCA stroke and new onset afib. She was seen by neurology and started on eliquis. She is seen in her room today. She is hard of hearing. She denies any concerns this visit. She is alert and oriented to person and place.   Review of Systems:  Constitutional: positive for easy fatigue. Negative for fever, chills, diaphoresis.  HENT: Negative for headache, congestion, sore throat, difficulty swallowing.  has runny nose Eyes: Negative for eye pain, blurred vision, double vision and discharge.  Respiratory: Negative for cough, shortness of breath and wheezing.   Cardiovascular: Negative for chest pain, palpitations, leg swelling.  Gastrointestinal: Negative for heartburn, nausea, vomiting, abdominal pain. Does not remember her last bowel movement Genitourinary: Negative for dysuria, flank pain.  Musculoskeletal: Negative for back pain, falls in the facility Skin: Negative for itching, rash.  Neurological: Negative for dizziness, tingling, focal weakness Psychiatric/Behavioral: Negative for depression. Has history of dementia.   Past Medical History  Diagnosis Date  . Hyperthyroidism   . Hyperthyroidism    No past surgical history on file. Social History:   reports that she has never smoked. She has never used smokeless tobacco. She reports that she does not drink alcohol or use illicit drugs.  No family history on  file.  Medications:   Medication List       This list is accurate as of: 10/21/15  3:19 PM.  Always use your most recent med list.               acetaminophen 500 MG tablet  Commonly known as:  TYLENOL  Take 500 mg by mouth 2 (two) times daily.     amLODipine 10 MG tablet  Commonly known as:  NORVASC  Take 1 tablet (10 mg total) by mouth daily.     apixaban 2.5 MG Tabs tablet  Commonly known as:  ELIQUIS  Take 1 tablet (2.5 mg total) by mouth 2 (two) times daily.     atorvastatin 20 MG tablet  Commonly known as:  LIPITOR  Take 1 tablet (20 mg total) by mouth daily at 6 PM.     levothyroxine 125 MCG tablet  Commonly known as:  SYNTHROID, LEVOTHROID  Take 0.5 tablets (62.5 mcg total) by mouth daily before breakfast.     RESOURCE THICKENUP CLEAR Powd  With all fluids     senna-docusate 8.6-50 MG tablet  Commonly known as:  Senokot-S  Take 1 tablet by mouth at bedtime as needed for mild constipation.         Physical Exam: Filed Vitals:   10/21/15 1519  BP: 181/79  Pulse: 68  Temp: 98.3 F (36.8 C)  Resp: 18  SpO2: 93%    General- elderly female, well built, in no acute distress Head- normocephalic, atraumatic, has drooling of saliva from left angle of her mouth Nose-  normal nasal mucosa, no maxillary or frontal sinus tenderness, clear nasal discharge Throat- moist mucus membrane, halitosis + Eyes- PERRLA, EOMI, no pallor, no icterus Neck- no cervical lymphadenopathy Cardiovascular- normal s1,s2, no leg edema Respiratory- bilateral clear to auscultation, no use of accessory muscles Abdomen- bowel sounds present, soft, non tender Musculoskeletal- able to move all 4 extremities, generalized weakness, unsteady gait and on wheelchair Neurological- no focal deficit, alert and oriented to person, place but not to time Skin- warm and dry, easy bruising Psychiatry- normal mood and affect    Labs reviewed: Basic Metabolic Panel:  Recent Labs   10/16/15 1939 10/16/15 1944  NA 140 140  K 4.3 4.1  CL 106 105  CO2 23  --   GLUCOSE 125* 122*  BUN 25* 27*  CREATININE 0.91 0.80  CALCIUM 9.6  --    Liver Function Tests:  Recent Labs  10/16/15 1939  AST 27  ALT 20  ALKPHOS 75  BILITOT 1.1  PROT 6.4*  ALBUMIN 3.6   No results for input(s): LIPASE, AMYLASE in the last 8760 hours. No results for input(s): AMMONIA in the last 8760 hours. CBC:  Recent Labs  10/16/15 1939 10/16/15 1944  WBC 6.6  --   NEUTROABS 3.8  --   HGB 13.8 15.3*  HCT 41.0 45.0  MCV 93.4  --   PLT 161  --    Cardiac Enzymes: No results for input(s): CKTOTAL, CKMB, CKMBINDEX, TROPONINI in the last 8760 hours. BNP: Invalid input(s): POCBNP CBG:  Recent Labs  10/16/15 2001  GLUCAP 104*    Radiological Exams: Dg Eye Foreign Body  10/17/2015  CLINICAL DATA:  Metal working/exposure; clearance prior to MRI EXAM: ORBITS FOR FOREIGN BODY - 2 VIEW COMPARISON:  None. FINDINGS: There is no evidence of metallic foreign body within the orbits. No significant bone abnormality identified. IMPRESSION: No evidence of metallic foreign body within the orbits. Electronically Signed   By: Sherian Rein M.D.   On: 10/17/2015 09:26   Dg Chest 2 View  10/17/2015  CLINICAL DATA:  Stroke EXAM: CHEST  2 VIEW COMPARISON:  None. FINDINGS: Stable enlarged cardiac silhouette. No effusion, infiltrate, pneumothorax. No acute osseous abnormality. Calcified hilar lymph node. IMPRESSION: Cardiomegaly without acute cardiopulmonary findings. Cannot exclude pericardial effusion. Electronically Signed   By: Genevive Bi M.D.   On: 10/17/2015 08:45   Dg Abd 1 View  10/17/2015  CLINICAL DATA:  Stroke EXAM: ABDOMEN - 1 VIEW COMPARISON:  None FINDINGS: There is a moderate stool burden identified throughout the colon. No dilated small bowel loops. No abnormal bowel dilatation. IMPRESSION: Moderate stool burden in the colon.  No obstruction. Electronically Signed   By: Signa Kell M.D.   On: 10/17/2015 09:38   Ct Head Wo Contrast  10/16/2015  CLINICAL DATA:  81 year old female with code stroke. Left facial droop and left-sided weakness. EXAM: CT HEAD WITHOUT CONTRAST TECHNIQUE: Contiguous axial images were obtained from the base of the skull through the vertex without intravenous contrast. COMPARISON:  None. FINDINGS: The ventricles are dilated and the sulci are prominent compatible with age-related atrophy. Periventricular and deep white matter hypodensities represent chronic microvascular ischemic changes. There is no intracranial hemorrhage. No mass effect or midline shift identified. The visualized paranasal sinuses and mastoid air cells are well aerated. The calvarium is intact. IMPRESSION: No acute intracranial hemorrhage. Age-related atrophy and chronic microvascular ischemic disease. If symptoms persist and there are no contraindications, MRI may provide better evaluation if clinically indicated. These results  were called by telephone at the time of interpretation on 10/16/2015 at 7:53 pm to Dr. Hosie PoissonSumner, who verbally acknowledged these results. Electronically Signed   By: Elgie CollardArash  Radparvar M.D.   On: 10/16/2015 19:55   Mr Brain Wo Contrast  10/17/2015  CLINICAL DATA:  Left-sided weakness.  Stroke. EXAM: MRI HEAD WITHOUT CONTRAST MRA HEAD WITHOUT CONTRAST TECHNIQUE: Multiplanar, multiecho pulse sequences of the brain and surrounding structures were obtained without intravenous contrast. Angiographic images of the head were obtained using MRA technique without contrast. COMPARISON:  CT head 10/16/2015 FINDINGS: MRI HEAD FINDINGS Acute infarct involving the right posterior insula and parietal operculum. No other areas of acute infarct. Advanced atrophy with prominent ventricles and subarachnoid space diffusely. Chronic microvascular ischemic change in the white matter. Mild chronic ischemia in the pons. Negative for hemorrhage or fluid collection Negative for mass or edema.   No shift of the midline structures Normal pituitary.  Paranasal sinuses clear. MRA HEAD FINDINGS Image quality degraded by motion. Small right vertebral artery is hypoplastic and possibly disease. Small contribution to the basilar. Distal left vertebral artery patent to the basilar. Basilar is irregular compatible with mild atherosclerotic disease. Fetal origin right posterior cerebral artery with hypoplastic right P1 segment. Both posterior cerebral arteries are patent. Internal carotid artery patent bilaterally. Anterior cerebral arteries patent bilaterally. M1 segment patent bilaterally. Decreased signal in the middle cerebral artery branches bilaterally right greater than left. Probable moderate stenosis of the right MCA bifurcation. Bilateral MCA atherosclerotic disease No aneurysm identified IMPRESSION: Acute infarct right insula and parietal operculum Advanced atrophy. Chronic microvascular ischemic change in the white matter MRA degraded by motion. Decreased signal middle cerebral arteries bilaterally right greater than left consistent with atherosclerotic disease. Electronically Signed   By: Marlan Palauharles  Clark M.D.   On: 10/17/2015 08:21   Mr Maxine GlennMra Head/brain Wo Cm  10/17/2015  CLINICAL DATA:  Left-sided weakness.  Stroke. EXAM: MRI HEAD WITHOUT CONTRAST MRA HEAD WITHOUT CONTRAST TECHNIQUE: Multiplanar, multiecho pulse sequences of the brain and surrounding structures were obtained without intravenous contrast. Angiographic images of the head were obtained using MRA technique without contrast. COMPARISON:  CT head 10/16/2015 FINDINGS: MRI HEAD FINDINGS Acute infarct involving the right posterior insula and parietal operculum. No other areas of acute infarct. Advanced atrophy with prominent ventricles and subarachnoid space diffusely. Chronic microvascular ischemic change in the white matter. Mild chronic ischemia in the pons. Negative for hemorrhage or fluid collection Negative for mass or edema.  No shift  of the midline structures Normal pituitary.  Paranasal sinuses clear. MRA HEAD FINDINGS Image quality degraded by motion. Small right vertebral artery is hypoplastic and possibly disease. Small contribution to the basilar. Distal left vertebral artery patent to the basilar. Basilar is irregular compatible with mild atherosclerotic disease. Fetal origin right posterior cerebral artery with hypoplastic right P1 segment. Both posterior cerebral arteries are patent. Internal carotid artery patent bilaterally. Anterior cerebral arteries patent bilaterally. M1 segment patent bilaterally. Decreased signal in the middle cerebral artery branches bilaterally right greater than left. Probable moderate stenosis of the right MCA bifurcation. Bilateral MCA atherosclerotic disease No aneurysm identified IMPRESSION: Acute infarct right insula and parietal operculum Advanced atrophy. Chronic microvascular ischemic change in the white matter MRA degraded by motion. Decreased signal middle cerebral arteries bilaterally right greater than left consistent with atherosclerotic disease. Electronically Signed   By: Marlan Palauharles  Clark M.D.   On: 10/17/2015 08:21    Assessment/Plan  Physical deconditioning Post acute cva, Will have patient work with PT/OT as  tolerated to regain strength and restore function.  Fall precautions are in place.  Acute CVA Continue her eliquis and lipitor for now. To work with therapy team. To be evaluated by SLP team with high aspiration risk.   afib Rate controlled. Continue eliquis 2.5 mg bid  HTN Elevated BP reading. Currently on amlodipine 10 mg daily. Add metoprolol tartrate 12.5 mg daily and monitor bp q shift for now. Goal bp < 150/90 BP Readings from Last 3 Encounters:  10/21/15 181/79  10/20/15 151/56   Dysphagia Continue puree food with honey thick liquid, aspiration precautions, SLP to follow  HLD Continue lipitor 20 mg daily  Hypothyroidism Continue synthroid 125 mcg  daily  Constipation On senokot s qhs prn, change this to 1 tab daily for now and monitor  Rhinitis Clear discharge, possible allergy, start claritin 5 mg qhs x 3 days, then daily as needed only   Goals of care: short term rehabilitation   Labs/tests ordered: cbc, cmp 10/27/15  Family/ staff Communication: reviewed care plan with patient and nursing supervisor    Oneal Grout, MD  [facility] [phone] (Monday-Friday 8 am - 5 pm) [phone] (afterhours)

## 2015-11-05 ENCOUNTER — Non-Acute Institutional Stay (SKILLED_NURSING_FACILITY): Payer: Medicare Other | Admitting: Adult Health

## 2015-11-05 DIAGNOSIS — I63511 Cerebral infarction due to unspecified occlusion or stenosis of right middle cerebral artery: Secondary | ICD-10-CM | POA: Diagnosis not present

## 2015-11-05 DIAGNOSIS — K5900 Constipation, unspecified: Secondary | ICD-10-CM | POA: Diagnosis not present

## 2015-11-05 DIAGNOSIS — E785 Hyperlipidemia, unspecified: Secondary | ICD-10-CM

## 2015-11-05 DIAGNOSIS — I10 Essential (primary) hypertension: Secondary | ICD-10-CM | POA: Diagnosis not present

## 2015-11-05 DIAGNOSIS — R531 Weakness: Secondary | ICD-10-CM | POA: Diagnosis not present

## 2015-11-05 DIAGNOSIS — E039 Hypothyroidism, unspecified: Secondary | ICD-10-CM

## 2015-11-05 DIAGNOSIS — B370 Candidal stomatitis: Secondary | ICD-10-CM | POA: Diagnosis not present

## 2015-11-06 ENCOUNTER — Encounter: Payer: Self-pay | Admitting: Adult Health

## 2015-11-06 NOTE — Progress Notes (Signed)
Patient ID: Jordan Ayala, female   DOB: 1911/04/22, 82 y.o.   MRN: [id number]    DATE:  11/05/15  MRN:  [id number]  BIRTHDAY: 13-Jul-1911  Facility:  Nursing Home Location:  [facility]  Nursing Home Room Number: 102-P  LEVEL OF CARE:  SNF (31)  Contact Information    Name Relation Home Work Mobile   Dimmick,Mary Niece [phone]  [phone]   Dimmick,Larry Relative   [phone]       Code Status History    Date Active Date Inactive Code Status Order ID Comments User Context   10/19/2015  7:15 AM 10/20/2015  6:23 PM DNR [id number]  Starleen Arms, MD Inpatient   10/17/2015 12:35 AM 10/19/2015  7:15 AM Full Code [id number]  Clydie Braun, MD Inpatient    Questions for Most Recent Historical Code Status (Order [id number])    Question Answer Comment   In the event of cardiac or respiratory ARREST Do not call a "code blue"    In the event of cardiac or respiratory ARREST Do not perform Intubation, CPR, defibrillation or ACLS    In the event of cardiac or respiratory ARREST Use medication by any route, position, wound care, and other measures to relive pain and suffering. May use oxygen, suction and manual treatment of airway obstruction as needed for comfort.        Chief Complaint  Patient presents with  . Discharge Note    Generalized weakness, acute CVA, hypertension, hyperlipidemia, hypothyroidism, constipation and Oral Candida    HISTORY OF PRESENT ILLNESS:  This is 82 year old female who is for discharge home with home health PT, OT, nursing and CNA. She has been admitted to [facility] on 10/20/15 from [facility] with acute right MCA stroke and new onset atrial fibrillation. Neurology was consulted and was started on Eliquis.  Patient was admitted to this facility for short-term rehabilitation after the patient's recent hospitalization.  Patient has completed SNF rehabilitation and therapy has cleared the patient for  discharge.  PAST MEDICAL HISTORY:  Past Medical History  Diagnosis Date  . Hyperthyroidism   . Hyperthyroidism      CURRENT MEDICATIONS: Reviewed  Patient's Medications  New Prescriptions   No medications on file  Previous Medications   ACETAMINOPHEN (TYLENOL) 500 MG TABLET    Take 500 mg by mouth 2 (two) times daily.   AMLODIPINE (NORVASC) 10 MG TABLET    Take 1 tablet (10 mg total) by mouth daily.   APIXABAN (ELIQUIS) 2.5 MG TABS TABLET    Take 1 tablet (2.5 mg total) by mouth 2 (two) times daily.   ATORVASTATIN (LIPITOR) 20 MG TABLET    Take 1 tablet (20 mg total) by mouth daily at 6 PM.   LEVOTHYROXINE (SYNTHROID, LEVOTHROID) 125 MCG TABLET    Take 0.5 tablets (62.5 mcg total) by mouth daily before breakfast.   MALTODEXTRIN-XANTHAN GUM (RESOURCE THICKENUP CLEAR) POWD    With all fluids   METOPROLOL TARTRATE (LOPRESSOR) 25 MG TABLET    Take 12.5 mg by mouth daily. Hold for SBP <110 or HR <60/min   NYSTATIN (MYCOSTATIN) 100000 UNIT/ML SUSPENSION    Take 5 mLs by mouth 4 (four) times daily. X2 weeks then discontinue   SENNOSIDES-DOCUSATE SODIUM (SENOKOT-S) 8.6-50 MG TABLET    Take 1 tablet by mouth at bedtime.  Modified Medications   No medications on file  Discontinued Medications   SENNA-DOCUSATE (SENOKOT-S) 8.6-50 MG TABLET    Take 1 tablet by mouth  at bedtime as needed for mild constipation.     Allergies  Allergen Reactions  . Codeine Other (See Comments)    Per MAR  . Demerol [Meperidine] Other (See Comments)    Per MAR  . Vioxx [Rofecoxib] Other (See Comments)    Per MAR     REVIEW OF SYSTEMS:  GENERAL: no change in appetite, no fatigue, no weight changes, no fever, chills or weakness EYES: Denies change in vision, dry eyes, eye pain, itching or discharge EARS: Denies change in hearing, ringing in ears, or earache NOSE: Denies nasal congestion or epistaxis MOUTH and THROAT: Denies oral discomfort, gingival pain or bleeding, pain from teeth or hoarseness    RESPIRATORY: no cough, SOB, DOE, wheezing, hemoptysis CARDIAC: no chest pain, edema or palpitations GI: no abdominal pain, diarrhea, constipation, heart burn, nausea or vomiting GU: Denies dysuria, frequency, hematuria, incontinence, or discharge PSYCHIATRIC: Denies feeling of depression or anxiety. No report of hallucinations, insomnia, paranoia, or agitation   PHYSICAL EXAMINATION  GENERAL APPEARANCE: Well nourished. In no acute distress. Normal body habitus HEAD: Normal in size and contour. No evidence of trauma EYES: Lids open and close normally. No blepharitis, entropion or ectropion. PERRL. Conjunctivae are clear and sclerae are white. Lenses are without opacity EARS: Pinnae are normal. Patient hears normal voice tunes of the examiner MOUTH and THROAT: Lips are without lesions. Oral mucosa is moist and without lesions. Tongue is covered with thick brownish coating NECK: supple, trachea midline, no neck masses, no thyroid tenderness, no thyromegaly LYMPHATICS: no LAN in the neck, no supraclavicular LAN RESPIRATORY: breathing is even & unlabored, BS CTAB CARDIAC: RRR, no murmur,no extra heart sounds, no edema GI: abdomen soft, normal BS, no masses, no tenderness, no hepatomegaly, no splenomegaly EXTREMITIES:  Able to move X 4 extremities, has generalized weakness PSYCHIATRIC: Alert and oriented to person. Affect and behavior are appropriate  LABS/RADIOLOGY: Labs reviewed: 10/27/15  chest x-ray shows no acute cardiopulmonary finding by single view exam; chronic chest findings are present WBC 6.7 hemoglobin 15.6 hematocrit 47.1 platelet 183 sodium 142 potassium 3.1 glucose 91 BUN 41 creatinine 0.77 calcium 8.6 total protein 6.1 albumin 3.35 alkaline phosphatase 90 SGOT 33 SGPT 26 Basic Metabolic Panel:  Recent Labs  45/40/98 1939 10/16/15 1944  NA 140 140  K 4.3 4.1  CL 106 105  CO2 23  --   GLUCOSE 125* 122*  BUN 25* 27*  CREATININE 0.91 0.80  CALCIUM 9.6  --    Liver  Function Tests:  Recent Labs  10/16/15 1939  AST 27  ALT 20  ALKPHOS 75  BILITOT 1.1  PROT 6.4*  ALBUMIN 3.6   CBC:  Recent Labs  10/16/15 1939 10/16/15 1944  WBC 6.6  --   NEUTROABS 3.8  --   HGB 13.8 15.3*  HCT 41.0 45.0  MCV 93.4  --   PLT 161  --    Lipid Panel:  Recent Labs  10/17/15 0538  HDL 61   CBG:  Recent Labs  10/16/15 2001  GLUCAP 104*      Dg Eye Foreign Body  10/17/2015  CLINICAL DATA:  Metal working/exposure; clearance prior to MRI EXAM: ORBITS FOR FOREIGN BODY - 2 VIEW COMPARISON:  None. FINDINGS: There is no evidence of metallic foreign body within the orbits. No significant bone abnormality identified. IMPRESSION: No evidence of metallic foreign body within the orbits. Electronically Signed   By: Sherian Rein M.D.   On: 10/17/2015 09:26   Dg Chest 2 View  10/17/2015  CLINICAL DATA:  Stroke EXAM: CHEST  2 VIEW COMPARISON:  None. FINDINGS: Stable enlarged cardiac silhouette. No effusion, infiltrate, pneumothorax. No acute osseous abnormality. Calcified hilar lymph node. IMPRESSION: Cardiomegaly without acute cardiopulmonary findings. Cannot exclude pericardial effusion. Electronically Signed   By: Genevive Bi M.D.   On: 10/17/2015 08:45   Dg Abd 1 View  10/17/2015  CLINICAL DATA:  Stroke EXAM: ABDOMEN - 1 VIEW COMPARISON:  None FINDINGS: There is a moderate stool burden identified throughout the colon. No dilated small bowel loops. No abnormal bowel dilatation. IMPRESSION: Moderate stool burden in the colon.  No obstruction. Electronically Signed   By: Signa Kell M.D.   On: 10/17/2015 09:38   Ct Head Wo Contrast  10/16/2015  CLINICAL DATA:  82 year old female with code stroke. Left facial droop and left-sided weakness. EXAM: CT HEAD WITHOUT CONTRAST TECHNIQUE: Contiguous axial images were obtained from the base of the skull through the vertex without intravenous contrast. COMPARISON:  None. FINDINGS: The ventricles are dilated and  the sulci are prominent compatible with age-related atrophy. Periventricular and deep white matter hypodensities represent chronic microvascular ischemic changes. There is no intracranial hemorrhage. No mass effect or midline shift identified. The visualized paranasal sinuses and mastoid air cells are well aerated. The calvarium is intact. IMPRESSION: No acute intracranial hemorrhage. Age-related atrophy and chronic microvascular ischemic disease. If symptoms persist and there are no contraindications, MRI may provide better evaluation if clinically indicated. These results were called by telephone at the time of interpretation on 10/16/2015 at 7:53 pm to Dr. Hosie Poisson, who verbally acknowledged these results. Electronically Signed   By: Elgie Collard M.D.   On: 10/16/2015 19:55   Mr Brain Wo Contrast  10/17/2015  CLINICAL DATA:  Left-sided weakness.  Stroke. EXAM: MRI HEAD WITHOUT CONTRAST MRA HEAD WITHOUT CONTRAST TECHNIQUE: Multiplanar, multiecho pulse sequences of the brain and surrounding structures were obtained without intravenous contrast. Angiographic images of the head were obtained using MRA technique without contrast. COMPARISON:  CT head 10/16/2015 FINDINGS: MRI HEAD FINDINGS Acute infarct involving the right posterior insula and parietal operculum. No other areas of acute infarct. Advanced atrophy with prominent ventricles and subarachnoid space diffusely. Chronic microvascular ischemic change in the white matter. Mild chronic ischemia in the pons. Negative for hemorrhage or fluid collection Negative for mass or edema.  No shift of the midline structures Normal pituitary.  Paranasal sinuses clear. MRA HEAD FINDINGS Image quality degraded by motion. Small right vertebral artery is hypoplastic and possibly disease. Small contribution to the basilar. Distal left vertebral artery patent to the basilar. Basilar is irregular compatible with mild atherosclerotic disease. Fetal origin right posterior  cerebral artery with hypoplastic right P1 segment. Both posterior cerebral arteries are patent. Internal carotid artery patent bilaterally. Anterior cerebral arteries patent bilaterally. M1 segment patent bilaterally. Decreased signal in the middle cerebral artery branches bilaterally right greater than left. Probable moderate stenosis of the right MCA bifurcation. Bilateral MCA atherosclerotic disease No aneurysm identified IMPRESSION: Acute infarct right insula and parietal operculum Advanced atrophy. Chronic microvascular ischemic change in the white matter MRA degraded by motion. Decreased signal middle cerebral arteries bilaterally right greater than left consistent with atherosclerotic disease. Electronically Signed   By: Marlan Palau M.D.   On: 10/17/2015 08:21   Mr Maxine Glenn Head/brain Wo Cm  10/17/2015  CLINICAL DATA:  Left-sided weakness.  Stroke. EXAM: MRI HEAD WITHOUT CONTRAST MRA HEAD WITHOUT CONTRAST TECHNIQUE: Multiplanar, multiecho pulse sequences of the brain and surrounding structures were obtained  without intravenous contrast. Angiographic images of the head were obtained using MRA technique without contrast. COMPARISON:  CT head 10/16/2015 FINDINGS: MRI HEAD FINDINGS Acute infarct involving the right posterior insula and parietal operculum. No other areas of acute infarct. Advanced atrophy with prominent ventricles and subarachnoid space diffusely. Chronic microvascular ischemic change in the white matter. Mild chronic ischemia in the pons. Negative for hemorrhage or fluid collection Negative for mass or edema.  No shift of the midline structures Normal pituitary.  Paranasal sinuses clear. MRA HEAD FINDINGS Image quality degraded by motion. Small right vertebral artery is hypoplastic and possibly disease. Small contribution to the basilar. Distal left vertebral artery patent to the basilar. Basilar is irregular compatible with mild atherosclerotic disease. Fetal origin right posterior cerebral  artery with hypoplastic right P1 segment. Both posterior cerebral arteries are patent. Internal carotid artery patent bilaterally. Anterior cerebral arteries patent bilaterally. M1 segment patent bilaterally. Decreased signal in the middle cerebral artery branches bilaterally right greater than left. Probable moderate stenosis of the right MCA bifurcation. Bilateral MCA atherosclerotic disease No aneurysm identified IMPRESSION: Acute infarct right insula and parietal operculum Advanced atrophy. Chronic microvascular ischemic change in the white matter MRA degraded by motion. Decreased signal middle cerebral arteries bilaterally right greater than left consistent with atherosclerotic disease. Electronically Signed   By: Marlan Palauharles  Clark M.D.   On: 10/17/2015 08:21    ASSESSMENT/PLAN:  Generalized weakness - for home health PT, OT, nursing and CNA  Acute CVA - continue Eliquis 2.5 mg 1 tab by mouth twice a day; follow-up with Dr. Roda ShuttersXu, neurology, in 2 months  Atrial fibrillation - rate controlled; continue Eliquis 2.5 mg 1 tab by mouth twice a day and metoprolol tartrate 12.5 mg daily  Hypertension - continue amlodipine 10 mg daily and metoprolol tartrate 12.5 mg daily  Hyperlipidemia - continue Lipitor 20 mg daily  Constipation - continue senna S1 tab by mouth daily at bedtime  Oral Candida - start nystatin 100,000 units/mL take 5 ML swish and swallow 4 times a day 2 weeks    I have filled out patient's discharge paperwork and written prescriptions.  Patient will receive home health PT, OT, Nursing and CNA.  Total discharge time: Greater than 30 minutes  Discharge time involved coordination of the discharge process with social worker, nursing staff and therapy department. Medical justification for home health services verified.     [facility] Hug[facility] HospitalMEDINA-VARGAS,MONINA, NP [facility] [phone]

## 2015-11-09 DIAGNOSIS — I69354 Hemiplegia and hemiparesis following cerebral infarction affecting left non-dominant side: Secondary | ICD-10-CM | POA: Diagnosis not present

## 2015-11-09 DIAGNOSIS — E785 Hyperlipidemia, unspecified: Secondary | ICD-10-CM | POA: Diagnosis not present

## 2015-11-09 DIAGNOSIS — I119 Hypertensive heart disease without heart failure: Secondary | ICD-10-CM | POA: Diagnosis not present

## 2015-11-09 DIAGNOSIS — M549 Dorsalgia, unspecified: Secondary | ICD-10-CM | POA: Diagnosis not present

## 2015-11-09 DIAGNOSIS — F0390 Unspecified dementia without behavioral disturbance: Secondary | ICD-10-CM | POA: Diagnosis not present

## 2015-11-09 DIAGNOSIS — I480 Paroxysmal atrial fibrillation: Secondary | ICD-10-CM | POA: Diagnosis not present

## 2015-12-29 ENCOUNTER — Telehealth: Payer: Self-pay | Admitting: Neurology

## 2015-12-29 NOTE — Telephone Encounter (Signed)
Nephew who did not give name, called requesting information regarding ELIQUIS that was prescribed to patient. Wanted to know who prescribed it, insurance company is needing information. Advised nephew that we do not have a DPR (authorization to release medical information) on file to speak with him. Asked if patient was there where I could speak with her, he advised she's 89. (Patient was scheduled for stroke follow up/New Patient appointment on 12/30/15 and was cancelled by niece), advised nephew that he may want to call the hospital for this information, nephew states hospital referred him to our office, advised that insurance company could fax the information they are requesting, offered our fax#, nephew declined and hung up.

## 2015-12-30 ENCOUNTER — Ambulatory Visit: Payer: Medicare Other | Admitting: Neurology

## 2016-01-29 ENCOUNTER — Encounter: Payer: Self-pay | Admitting: Neurology

## 2016-01-29 ENCOUNTER — Ambulatory Visit (INDEPENDENT_AMBULATORY_CARE_PROVIDER_SITE_OTHER): Payer: Medicare Other | Admitting: Neurology

## 2016-01-29 VITALS — BP 130/53 | HR 49 | Ht <= 58 in | Wt 125.2 lb

## 2016-01-29 DIAGNOSIS — E785 Hyperlipidemia, unspecified: Secondary | ICD-10-CM

## 2016-01-29 DIAGNOSIS — Z7901 Long term (current) use of anticoagulants: Secondary | ICD-10-CM

## 2016-01-29 DIAGNOSIS — I10 Essential (primary) hypertension: Secondary | ICD-10-CM | POA: Insufficient documentation

## 2016-01-29 DIAGNOSIS — I63511 Cerebral infarction due to unspecified occlusion or stenosis of right middle cerebral artery: Secondary | ICD-10-CM | POA: Diagnosis not present

## 2016-01-29 DIAGNOSIS — I480 Paroxysmal atrial fibrillation: Secondary | ICD-10-CM | POA: Diagnosis not present

## 2016-01-29 NOTE — Progress Notes (Signed)
STROKE NEUROLOGY FOLLOW UP NOTE  NAME: Jordan Ayala DOB: 1911-09-06  REASON FOR VISIT: stroke follow up HISTORY FROM: daughter in law and chart  Today we had the pleasure of seeing Jordan Ayala Hohmann in follow-up at our Neurology Clinic. Pt was accompanied by daughter in law.   History Summary Jordan Ayala is a [age] y.o. female with history of hypothyroidism and dementia admitted on 10/16/15 for speech difficulties and left-sided weakness. Afib noted in ED.MRI showed acute infarct at right insula and parietal operculum. MRA was motion degraded but diffuse athero. TTE showed EF 55-60% but in afib. CUS unremarkable. LDL 102 and A1C 6.1. She was high functioning at home and lives alone, we started eliquis 2.5mg  bid and lipitor 20 for her. She was discharged to SNF.  Interval History During the interval time, the patient has been doing much better. He speech and her left side strength back to baseline. She went back from SNF to her assistant living facility. She is on regular diet. Back to walk with her walker from wheelchair. No side effect from eliquis. BP today 130/53.     REVIEW OF SYSTEMS: Full 14 system review of systems performed and notable only for those listed below and in HPI above, all others are negative:  Constitutional:   Cardiovascular: leg swelling Ear/Nose/Throat:   Skin:  Eyes:   Respiratory:   Gastroitestinal:   Genitourinary:  Hematology/Lymphatic:   Endocrine:  Musculoskeletal:   Allergy/Immunology:   Neurological:   Psychiatric:  Sleep:   The following represents the patient's updated allergies and side effects list: Allergies  Allergen Reactions  . Codeine Other (See Comments)    Per MAR  . Demerol [Meperidine] Other (See Comments)    Per MAR  . Vioxx [Rofecoxib] Other (See Comments)    Per MAR    The neurologically relevant items on the patient's problem list were reviewed on today's visit.  Neurologic Examination  A problem focused  neurological exam (12 or more points of the single system neurologic examination, vital signs counts as 1 point, cranial nerves count for 8 points) was performed.  Blood pressure 130/53, pulse 49, height 4\' 10"  (1.473 m), weight 125 lb 3.2 oz (56.79 kg).  General - Well nourished, well developed, in no apparent distress.  Ophthalmologic - Fundi not visualized due to noncooperatoin.  Cardiovascular - Regular rate and rhythm, not in afib.  Mental Status -  Level of arousal and orientation to person were intact, not to time and place. Language including expression, naming, comprehension was assessed and found intact, but mild difficulty with repeating complex sentences.  Cranial Nerves II - XII - II - Visual field grossly intact OU. III, IV, VI - Extraocular movements intact. V - Facial sensation intact bilaterally. VII - Facial movement intact bilaterally. VIII - Hearing & vestibular intact bilaterally. X - Palate elevates symmetrically. XI - Chin turning & shoulder shrug intact bilaterally. XII - Tongue protrusion intact.  Motor Strength - The patient's strength was 4/5 in all extremities and pronator drift was absent.  Bulk was normal and fasciculations were absent.   Motor Tone - Muscle tone was assessed at the neck and appendages and was normal.  Reflexes - The patient's reflexes were 1+ in all extremities and she had no pathological reflexes.  Sensory - Light touch, temperature/pinprick were assessed and were normal.    Coordination - The patient had normal movements in the hands with no ataxia or dysmetria.  Tremor was absent.  Gait and Station -  walk with rolling walking, stooped posturing but steady.  Data reviewed: I personally reviewed the images and agree with the radiology interpretations.  Dg Chest 2 View 10/17/2015  Cardiomegaly without acute cardiopulmonary findings. Cannot exclude pericardial effusion.   Ct Head Wo Contrast 10/16/2015  No acute  intracranial hemorrhage. Age-related atrophy and chronic microvascular ischemic disease. If symptoms persist and there are no contraindications, MRI may provide better evaluation if clinically indicated.   Mri & Mra Head/brain Wo Cm 10/17/2015  Acute infarct right insula and parietal operculum Advanced atrophy. Chronic microvascular ischemic change in the white matter MRA degraded by motion. Decreased signal middle cerebral arteries bilaterally right greater than left consistent with atherosclerotic disease.   2D Echocardiogram  - Left ventricle: The cavity size was normal. Wall thickness was normal. Systolic function was normal. The estimated ejection fraction was in the range of 55% to 60%. Wall motion was normal;there were no regional wall motion abnormalities. - Aortic valve: Valve mobility was restricted. There was mildregurgitation. - Mitral valve: There was mild regurgitation. - Left atrium: The atrium was severely dilated. - Right ventricle: The cavity size was mildly dilated. - Right atrium: The atrium was severely dilated. - Tricuspid valve: There was moderate-severe regurgitation. - Pulmonary arteries: Systolic pressure was moderately to severelyincreased. PA peak pressure: 64 mm Hg (S). Impressions: Normal LV function; severe biatrial enlargement; mild RVE; calcified aortic valve with fixed noncoronary cusp; mild AI; mild MR; moderate to severe TR; moderate to severe elevation in pulmonary pressure; small pericardial effusion; patient appears to be in atrial fibrillation.  CUS - Bilateral: 1-39% ICA stenosis. Vertebral artery flow is antegrade.   Component     Latest Ref Rng 10/17/2015  Cholesterol     0 - 200 mg/dL 161  Triglycerides     <150 mg/dL 52  HDL Cholesterol     >40 mg/dL 61  Total CHOL/HDL Ratio      2.8  VLDL     0 - 40 mg/dL 10  LDL (calc)     0 - 99 mg/dL 096 (H)  Hemoglobin E4V     4.8 - 5.6 % 6.1 (H)  Mean Plasma Glucose      128  TSH      0.350 - 4.500 uIU/mL 4.083  T4,Free(Direct)     0.61 - 1.12 ng/dL 4.09    Assessment: As you may recall, she is a 82 y.o. Caucasian female with PMH of hypothyroidism and dementia admitted on 10/16/15 for speech difficulties and left-sided weakness. Afib noted in ED.MRI showed acute infarct at right insula and parietal operculum. MRA was motion degraded but diffuse athero. TTE showed EF 55-60% but in afib. CUS unremarkable. LDL 102 and A1C 6.1. She was high functioning at home and lives alone, we started eliquis 2.5mg  bid and lipitor 20 for her. She was discharged to SNF. During the interval time, her speech and left side strength back to baseline. She went back from SNF to her assistant living facility and back to walk with her walker from wheelchair. Continued on eliquis and liptor   Plan:  - continue eliquis and lipitor for stroke prevention - check BP in ALF daily - continue self exercise at home - Follow up with your primary care physician for stroke risk factor modification. Recommend maintain blood pressure goal <130/80, diabetes with hemoglobin A1c goal below 6.5% and lipids with LDL cholesterol goal below 70 mg/dL.  - will contact eliquis company to see if you can get reimbursement for the  last a couple of months - follow up in 6 months.   I spent more than 25 minutes of face to face time with the patient. Greater than 50% of time was spent in counseling and coordination of care.  No orders of the defined types were placed in this encounter.    Meds ordered this encounter  Medications  . furosemide (LASIX) 20 MG tablet    Sig:     Patient Instructions  - continue eliquis and lipitor for stroke prevention - check BP in ALF daily - continue self exercise at home - Follow up with your primary care physician for stroke risk factor modification. Recommend maintain blood pressure goal <130/80, diabetes with hemoglobin A1c goal below 6.5% and lipids with LDL cholesterol goal below 70  mg/dL.  - will contact eliquis company to see if you can get reimbursement for the last a couple of months - follow up in 6 months.     Marvel Plan, MD PhD [facility] [address] [place], [place] [address] [phone]

## 2016-01-29 NOTE — Patient Instructions (Signed)
-   continue eliquis and lipitor for stroke prevention - check BP in ALF daily - continue self exercise at home - Follow up with your primary care physician for stroke risk factor modification. Recommend maintain blood pressure goal <130/80, diabetes with hemoglobin A1c goal below 6.5% and lipids with LDL cholesterol goal below 70 mg/dL.  - will contact eliquis company to see if you can get reimbursement for the last a couple of months - follow up in 6 months.

## 2016-03-01 ENCOUNTER — Emergency Department (HOSPITAL_COMMUNITY)
Admission: EM | Admit: 2016-03-01 | Discharge: 2016-03-02 | Disposition: A | Discharge status: Home / Self Care | Payer: Medicare Other | Attending: Emergency Medicine | Admitting: Emergency Medicine

## 2016-03-01 ENCOUNTER — Encounter (HOSPITAL_COMMUNITY): Payer: Self-pay | Admitting: Emergency Medicine

## 2016-03-01 ENCOUNTER — Emergency Department (HOSPITAL_COMMUNITY): Payer: Medicare Other

## 2016-03-01 DIAGNOSIS — Z79899 Other long term (current) drug therapy: Secondary | ICD-10-CM | POA: Diagnosis not present

## 2016-03-01 DIAGNOSIS — G300 Alzheimer's disease with early onset: Secondary | ICD-10-CM | POA: Diagnosis not present

## 2016-03-01 DIAGNOSIS — I10 Essential (primary) hypertension: Secondary | ICD-10-CM | POA: Diagnosis not present

## 2016-03-01 DIAGNOSIS — Y998 Other external cause status: Secondary | ICD-10-CM | POA: Insufficient documentation

## 2016-03-01 DIAGNOSIS — S0990XA Unspecified injury of head, initial encounter: Secondary | ICD-10-CM | POA: Diagnosis present

## 2016-03-01 DIAGNOSIS — R234 Changes in skin texture: Secondary | ICD-10-CM | POA: Insufficient documentation

## 2016-03-01 DIAGNOSIS — E039 Hypothyroidism, unspecified: Secondary | ICD-10-CM | POA: Diagnosis not present

## 2016-03-01 DIAGNOSIS — S0083XA Contusion of other part of head, initial encounter: Secondary | ICD-10-CM | POA: Diagnosis not present

## 2016-03-01 DIAGNOSIS — W01198A Fall on same level from slipping, tripping and stumbling with subsequent striking against other object, initial encounter: Secondary | ICD-10-CM | POA: Diagnosis not present

## 2016-03-01 DIAGNOSIS — Y9389 Activity, other specified: Secondary | ICD-10-CM | POA: Diagnosis not present

## 2016-03-01 DIAGNOSIS — Z7901 Long term (current) use of anticoagulants: Secondary | ICD-10-CM | POA: Diagnosis not present

## 2016-03-01 DIAGNOSIS — E785 Hyperlipidemia, unspecified: Secondary | ICD-10-CM | POA: Insufficient documentation

## 2016-03-01 DIAGNOSIS — S0081XA Abrasion of other part of head, initial encounter: Secondary | ICD-10-CM | POA: Insufficient documentation

## 2016-03-01 DIAGNOSIS — R001 Bradycardia, unspecified: Secondary | ICD-10-CM | POA: Diagnosis not present

## 2016-03-01 DIAGNOSIS — Y9289 Other specified places as the place of occurrence of the external cause: Secondary | ICD-10-CM | POA: Diagnosis not present

## 2016-03-01 HISTORY — DX: Hemiplegia and hemiparesis following cerebral infarction affecting left non-dominant side: I69.354

## 2016-03-01 HISTORY — DX: Hypothyroidism, unspecified: E03.9

## 2016-03-01 HISTORY — DX: Hyperlipidemia, unspecified: E78.5

## 2016-03-01 HISTORY — DX: Dementia in other diseases classified elsewhere, unspecified severity, without behavioral disturbance, psychotic disturbance, mood disturbance, and anxiety: F02.80

## 2016-03-01 HISTORY — DX: Age-related physical debility: R54

## 2016-03-01 HISTORY — DX: Alzheimer's disease with early onset: G30.0

## 2016-03-01 HISTORY — DX: Essential (primary) hypertension: I10

## 2016-03-01 HISTORY — DX: Unspecified atrial fibrillation: I48.91

## 2016-03-01 NOTE — ED Notes (Addendum)
Per [facility]  Unwitnessed fall pt fell on capert Unknown LOC. Ambulates with walker.  Hematoma to the left side Elaquis  Dementia to her baseline  Denies neck/back pain towel rolls abrasions on her face "skin condition" according to staff.  A fib as low as 44  Left side deficits from previous CVA.

## 2016-03-01 NOTE — ED Provider Notes (Signed)
CSN: [id number]     Arrival date & time 03/01/16  2225 History  By signing my name below, I, Tanda Rockers, attest that this documentation has been prepared under the direction and in the presence of Laurence Spates, MD. Electronically Signed: Tanda Rockers, ED Scribe. 03/01/2016. 11:25 PM.   Chief Complaint  Patient presents with  . Fall   The history is provided by the patient. No language interpreter was used.    HPI Comments: Klaira Pesci is a 82 y.o. female brought in by ambulance, with PMHx HTN, HLD, atrial fibrillation, and stroke who presents to the Emergency Department for unwitnessed fall that occurred earlier today. Pt states that she tripped and fell, hitting her head in the process. No LOC. Pt has a hematoma to the left side of her head. She has been able to ambulate since the incident. Pt has not complaints at this time. Denies visual changes, chest pain, extremity pain, abdominal pain, or any other associated symptoms. Pt is on Eliquis. Previous hx of CVA with left sided deficits.   Past Medical History  Diagnosis Date  . Hyperthyroidism   . Hyperthyroidism   . Stroke (HCC)   . Alzheimer's disease with early onset   . Hypothyroidism   . Age-related physical debility   . Hemiplegia and hemiparesis following cerebral infarction affecting left non-dominant side (HCC)   . Hyperlipemia   . Atrial fibrillation (HCC)   . Hypertension   . Essential hypertension    History reviewed. No pertinent past surgical history. No family history on file. Social History  Substance Use Topics  . Smoking status: Never Smoker   . Smokeless tobacco: Never Used  . Alcohol Use: No   OB History    No data available     Review of Systems 10 Systems reviewed and all are negative for acute change except as noted in the HPI.  Allergies  Codeine; Demerol; and Vioxx  Home Medications   Prior to Admission medications   Medication Sig Start Date End Date Taking? Authorizing  Provider  acetaminophen (TYLENOL) 500 MG tablet Take 500 mg by mouth 3 (three) times daily.    Yes Historical Provider, MD  amLODipine (NORVASC) 10 MG tablet Take 1 tablet (10 mg total) by mouth daily. 10/21/15  Yes Starleen Arms, MD  apixaban (ELIQUIS) 2.5 MG TABS tablet Take 1 tablet (2.5 mg total) by mouth 2 (two) times daily. 10/20/15  Yes Starleen Arms, MD  atorvastatin (LIPITOR) 20 MG tablet Take 1 tablet (20 mg total) by mouth daily at 6 PM. 10/20/15  Yes Starleen Arms, MD  levothyroxine (SYNTHROID, LEVOTHROID) 125 MCG tablet Take 0.5 tablets (62.5 mcg total) by mouth daily before breakfast. 10/20/15  Yes Starleen Arms, MD  metoprolol tartrate (LOPRESSOR) 25 MG tablet Take 12.5 mg by mouth daily. Hold for SBP <110 or HR <60/min   Yes Historical Provider, MD  sennosides-docusate sodium (SENOKOT-S) 8.6-50 MG tablet Take 1 tablet by mouth at bedtime.   Yes Historical Provider, MD  UNABLE TO FIND Take 1 each by mouth daily. Med Name: Mighty Shake   Yes Historical Provider, MD   BP 145/66 mmHg  Pulse 57  Resp 14  Ht  (1.6 m)  Wt 125 lb (56.7 kg)  BMI 22.15 kg/m2  SpO2 96%   Physical Exam  Constitutional: She is oriented to person, place, and time. She appears well-developed and well-nourished. No distress.  Awake, alert  HENT:  Head: Normocephalic.  Hematoma left  forehead  Eyes: Conjunctivae and EOM are normal. Pupils are equal, round, and reactive to light.  Neck: Neck supple.  Cardiovascular: Normal heart sounds.  An irregularly irregular rhythm present. Bradycardia present.   No murmur heard. Pulmonary/Chest: Effort normal and breath sounds normal. No respiratory distress.  Abdominal: Soft. Bowel sounds are normal. She exhibits no distension.  Musculoskeletal: She exhibits no edema.  Neurological: She is alert and oriented to person, place, and time. She has normal reflexes. No cranial nerve deficit. She exhibits normal muscle tone.  Fluent speech, normal  finger-to-nose testing, negative pronator drift, no clonus 4/5 strength and normal sensation to LUE. 5/5 strength and normal sensation to RUE.   Skin: Skin is warm and dry.  Abrasion over forehead and scattered scabs on the face  Psychiatric: She has a normal mood and affect. Judgment and thought content normal.  Nursing note and vitals reviewed.   ED Course  Procedures (including critical care time)   COORDINATION OF CARE: 11:17 PM-Discussed treatment plan which includes CT Head with pt at bedside and pt agreed to plan.   Labs Review Labs Reviewed - No data to display  Imaging Review Ct Head Wo Contrast  03/02/2016  ADDENDUM REPORT: 03/02/2016 01:17 ADDENDUM: Please note there is a typographical error in the first line of the impression of the original report. The correct impression should read: No acute intracranial hemorrhage. Electronically Signed   By: Elgie Collard M.D.   On: 03/02/2016 01:17  03/02/2016  CLINICAL DATA:  82 year old female with and weakness fall. EXAM: CT HEAD WITHOUT CONTRAST CT CERVICAL SPINE WITHOUT CONTRAST TECHNIQUE: Multidetector CT imaging of the head and cervical spine was performed following the standard protocol without intravenous contrast. Multiplanar CT image reconstructions of the cervical spine were also generated. COMPARISON:  Head CT dated 10/16/2015 and MRI dated 10/17/2015 FINDINGS: CT HEAD FINDINGS There is advanced age-related atrophy and chronic microvascular ischemic changes. No acute intracranial hemorrhage. No mass effect or midline shift. The visualized paranasal sinuses and mastoid air cells are clear. The calvarium is intact. Left forehead hematoma. CT CERVICAL SPINE FINDINGS There is no acute fracture or subluxation of the cervical spine.There is multilevel degenerative changes of the cervical spine.The odontoid and spinous processes are intact.There is normal anatomic alignment of the C1-C2 lateral masses. The visualized soft tissues  appear unremarkable. IMPRESSION: New acute intracranial hemorrhage. Age-related atrophy and chronic microvascular ischemic changes. No acute/ traumatic cervical spine pathology. Electronically Signed: By: Elgie Collard M.D. On: 03/02/2016 00:15   Ct Cervical Spine Wo Contrast  03/02/2016  ADDENDUM REPORT: 03/02/2016 01:17 ADDENDUM: Please note there is a typographical error in the first line of the impression of the original report. The correct impression should read: No acute intracranial hemorrhage. Electronically Signed   By: Elgie Collard M.D.   On: 03/02/2016 01:17  03/02/2016  CLINICAL DATA:  82 year old female with and weakness fall. EXAM: CT HEAD WITHOUT CONTRAST CT CERVICAL SPINE WITHOUT CONTRAST TECHNIQUE: Multidetector CT imaging of the head and cervical spine was performed following the standard protocol without intravenous contrast. Multiplanar CT image reconstructions of the cervical spine were also generated. COMPARISON:  Head CT dated 10/16/2015 and MRI dated 10/17/2015 FINDINGS: CT HEAD FINDINGS There is advanced age-related atrophy and chronic microvascular ischemic changes. No acute intracranial hemorrhage. No mass effect or midline shift. The visualized paranasal sinuses and mastoid air cells are clear. The calvarium is intact. Left forehead hematoma. CT CERVICAL SPINE FINDINGS There is no acute fracture or subluxation of  the cervical spine.There is multilevel degenerative changes of the cervical spine.The odontoid and spinous processes are intact.There is normal anatomic alignment of the C1-C2 lateral masses. The visualized soft tissues appear unremarkable. IMPRESSION: New acute intracranial hemorrhage. Age-related atrophy and chronic microvascular ischemic changes. No acute/ traumatic cervical spine pathology. Electronically Signed: By: Elgie CollardArash  Radparvar M.D. On: 03/02/2016 00:15      EKG Interpretation None      MDM   Final diagnoses:  Traumatic hematoma of forehead,  initial encounter   PT on Eliquis p/w unwitnessed mechanical fall, she denies LOC and recalls event. Fall occurred several hours ago and pt well appearing on arrival w/ stable VS. Hematoma on L forehead  But no other complaints of pain. Baseline L-sided weakness from previous stroke. Obtained CT of head and C-spine which were negative for acute process. On reexamination after a few hours of observation, the pt remained comfortable and alert with normal neurologic status. I discussed supportive care. As patient is at risk for delayed head bleed given her use of eliquis, I emphasized return precautions in her discharge paperwork for her nursing facility. Pt discharged in satisfactory condition.   I personally performed the services described in this documentation, which was scribed in my presence. The recorded information has been reviewed and is accurate.      Laurence Spatesachel Morgan Little, MD 03/02/16 [phone]

## 2016-03-02 NOTE — Discharge Instructions (Signed)
BECAUSE YOU ARE ON BLOOD THINNERS, YOU ARE AT INCREASED RISK OF A HEAD BLEED. ALTHOUGH THE PICTURES OF YOUR HEAD WERE NORMAL, PLEASE WATCH CAREFULLY FOR ANY NEUROLOGIC PROBLEMS SUCH AS CONFUSION, HEADACHE, VISUAL CHANGES, WEAKNESS, OR BALANCE PROBLEMS. IF YOU DEVELOP ANY OF THESE SYMPTOMS, SEEK IMMEDIATE MEDICAL ATTENTION.  Facial or Scalp Contusion A facial or scalp contusion is a deep bruise on the face or head. Injuries to the face and head generally cause a lot of swelling, especially around the eyes. Contusions are the result of an injury that caused bleeding under the skin. The contusion may turn blue, purple, or yellow. Minor injuries will give you a painless contusion, but more severe contusions may stay painful and swollen for a few weeks.  CAUSES  A facial or scalp contusion is caused by a blunt injury or trauma to the face or head area.  SIGNS AND SYMPTOMS   Swelling of the injured area.   Discoloration of the injured area.   Tenderness, soreness, or pain in the injured area.  DIAGNOSIS  The diagnosis can be made by taking a medical history and doing a physical exam. An X-ray exam, CT scan, or MRI may be needed to determine if there are any associated injuries, such as broken bones (fractures). TREATMENT  Often, the best treatment for a facial or scalp contusion is applying cold compresses to the injured area. Over-the-counter medicines may also be recommended for pain control.  HOME CARE INSTRUCTIONS   Only take over-the-counter or prescription medicines as directed by your health care provider.   Apply ice to the injured area.   Put ice in a plastic bag.   Place a towel between your skin and the bag.   Leave the ice on for 20 minutes, 2-3 times a day.  SEEK MEDICAL CARE IF:  You have bite problems.   You have pain with chewing.   You are concerned about facial defects. SEEK IMMEDIATE MEDICAL CARE IF:  You have severe pain or a headache that is not relieved  by medicine.   You have unusual sleepiness, confusion, or personality changes.   You throw up (vomit).   You have a persistent nosebleed.   You have double vision or blurred vision.   You have fluid drainage from your nose or ear.   You have difficulty walking or using your arms or legs.  MAKE SURE YOU:   Understand these instructions.  Will watch your condition.  Will get help right away if you are not doing well or get worse.   This information is not intended to replace advice given to you by your health care provider. Make sure you discuss any questions you have with your health care provider.   Document Released: 11/17/2004 Document Revised: 10/31/2014 Document Reviewed: 05/23/2013 Elsevier Interactive Patient Education [employer]2016 [employer]

## 2016-08-08 ENCOUNTER — Ambulatory Visit: Payer: Medicare Other | Admitting: Neurology

## 2017-01-05 ENCOUNTER — Encounter (HOSPITAL_COMMUNITY): Payer: Self-pay

## 2017-01-05 ENCOUNTER — Emergency Department (HOSPITAL_COMMUNITY): Payer: Medicare Other

## 2017-01-05 ENCOUNTER — Emergency Department (HOSPITAL_COMMUNITY)
Admission: EM | Admit: 2017-01-05 | Discharge: 2017-01-05 | Disposition: A | Discharge status: Home / Self Care | Payer: Medicare Other | Attending: Emergency Medicine | Admitting: Emergency Medicine

## 2017-01-05 DIAGNOSIS — I10 Essential (primary) hypertension: Secondary | ICD-10-CM | POA: Diagnosis not present

## 2017-01-05 DIAGNOSIS — G309 Alzheimer's disease, unspecified: Secondary | ICD-10-CM | POA: Insufficient documentation

## 2017-01-05 DIAGNOSIS — Y9389 Activity, other specified: Secondary | ICD-10-CM | POA: Insufficient documentation

## 2017-01-05 DIAGNOSIS — E039 Hypothyroidism, unspecified: Secondary | ICD-10-CM | POA: Diagnosis not present

## 2017-01-05 DIAGNOSIS — Z79899 Other long term (current) drug therapy: Secondary | ICD-10-CM | POA: Diagnosis not present

## 2017-01-05 DIAGNOSIS — W1839XA Other fall on same level, initial encounter: Secondary | ICD-10-CM | POA: Diagnosis not present

## 2017-01-05 DIAGNOSIS — S72112A Displaced fracture of greater trochanter of left femur, initial encounter for closed fracture: Secondary | ICD-10-CM | POA: Diagnosis not present

## 2017-01-05 DIAGNOSIS — W19XXXA Unspecified fall, initial encounter: Secondary | ICD-10-CM

## 2017-01-05 DIAGNOSIS — Y92128 Other place in nursing home as the place of occurrence of the external cause: Secondary | ICD-10-CM | POA: Diagnosis not present

## 2017-01-05 DIAGNOSIS — Y999 Unspecified external cause status: Secondary | ICD-10-CM | POA: Diagnosis not present

## 2017-01-05 DIAGNOSIS — S79912A Unspecified injury of left hip, initial encounter: Secondary | ICD-10-CM | POA: Diagnosis present

## 2017-01-05 DIAGNOSIS — Z8673 Personal history of transient ischemic attack (TIA), and cerebral infarction without residual deficits: Secondary | ICD-10-CM | POA: Insufficient documentation

## 2017-01-05 DIAGNOSIS — M25552 Pain in left hip: Secondary | ICD-10-CM

## 2017-01-05 DIAGNOSIS — Z7901 Long term (current) use of anticoagulants: Secondary | ICD-10-CM | POA: Diagnosis not present

## 2017-01-05 LAB — BASIC METABOLIC PANEL
ANION GAP: 6 (ref 5–15)
BUN: 15 mg/dL (ref 6–20)
CALCIUM: 9.1 mg/dL (ref 8.9–10.3)
CO2: 28 mmol/L (ref 22–32)
CREATININE: 0.78 mg/dL (ref 0.44–1.00)
Chloride: 103 mmol/L (ref 101–111)
GFR calc Af Amer: >60 mL/min (ref >60)
Glucose, Bld: 113 mg/dL — ABNORMAL HIGH (ref 65–99)
Potassium: 4.3 mmol/L (ref 3.5–5.1)
SODIUM: 137 mmol/L (ref 135–145)

## 2017-01-05 LAB — URINALYSIS, ROUTINE W REFLEX MICROSCOPIC
Bilirubin Urine: NEGATIVE
Glucose, UA: NEGATIVE mg/dL
Hgb urine dipstick: NEGATIVE
Ketones, ur: NEGATIVE mg/dL
LEUKOCYTES UA: NEGATIVE
Nitrite: NEGATIVE
PH: 5 (ref 5.0–8.0)
Protein, ur: NEGATIVE mg/dL
SPECIFIC GRAVITY, URINE: 1.019 (ref 1.005–1.030)

## 2017-01-05 LAB — CBC WITH DIFFERENTIAL/PLATELET
BASOS ABS: 0 10*3/uL (ref 0.0–0.1)
BASOS PCT: 0 %
EOS ABS: 0.1 10*3/uL (ref 0.0–0.7)
Eosinophils Relative: 1 %
HCT: 38.4 % (ref 36.0–46.0)
Hemoglobin: 13 g/dL (ref 12.0–15.0)
Lymphocytes Relative: 16 %
Lymphs Abs: 1.2 10*3/uL (ref 0.7–4.0)
MCH: 31.9 pg (ref 26.0–34.0)
MCHC: 33.9 g/dL (ref 30.0–36.0)
MCV: 94.1 fL (ref 78.0–100.0)
MONOS PCT: 8 %
Monocytes Absolute: 0.6 10*3/uL (ref 0.1–1.0)
NEUTROS PCT: 75 %
Neutro Abs: 5.5 10*3/uL (ref 1.7–7.7)
Platelets: 164 10*3/uL (ref 150–400)
RBC: 4.08 MIL/uL (ref 3.87–5.11)
RDW: 13.9 % (ref 11.5–15.5)
WBC: 7.3 10*3/uL (ref 4.0–10.5)

## 2017-01-05 LAB — I-STAT TROPONIN, ED: TROPONIN I, POC: 0 ng/mL (ref 0.00–0.08)

## 2017-01-05 LAB — POC OCCULT BLOOD, ED: Fecal Occult Bld: NEGATIVE

## 2017-01-05 MED ORDER — SODIUM CHLORIDE 0.9 % IV BOLUS (SEPSIS)
1000.0000 mL | Freq: Once | INTRAVENOUS | Status: AC
  Administered 2017-01-05: 1000 mL via INTRAVENOUS

## 2017-01-05 MED ORDER — ACETAMINOPHEN 500 MG PO TABS: 1000.0000 mg | ORAL_TABLET | Freq: Three times a day (TID) | ORAL | 0 refills | Status: AC

## 2017-01-05 MED ORDER — ACETAMINOPHEN 500 MG PO TABS
500.0000 mg | ORAL_TABLET | Freq: Three times a day (TID) | ORAL | Status: DC
  Administered 2017-01-05: 500 mg via ORAL
  Filled 2017-01-05: qty 1

## 2017-01-05 NOTE — ED Provider Notes (Addendum)
History is obtained from patient's niece accompanies her Patient reportedly fell 5 days ago she complains of left hip pain since the event she's been ambulatory since the fall. He was a witnessed fall. Also complains of mild left shoulder pain. On exam patient is alert H ENT exam tiny abrasion over left eyebrow otherwise normocephalic atraumatic neck supple no point tenderness no bruit chest is nontender abdomen nontender. Pelvis stable nontender. Lower extremity no deformity no swelling no ecchymosis his pain at left hip on internal and external rotation of thigh.  left shoulder skin intact minimally tender at shoulder. No deformity Radial pulse 2+ all other extremities or contusion abrasion or tenderness neurovascular intact. Neurologic alert cranial nerves II through XII grossly intact moves all extremity as well.    Doug SouSam Jahkeem Kurka, MD 01/05/17 1507 The patient is unable to cooperate with MRI scan. CT of hip ordered    Doug SouSam Stefon Ramthun, MD 01/05/17 1749

## 2017-01-05 NOTE — ED Provider Notes (Signed)
[facility]-EMERGENCY DEPT Provider Note   CSN: [id number] Arrival date & time: 01/05/17  1339     History   Chief Complaint Chief Complaint  Patient presents with  . Fall  . Hip Pain    HPI Jordan Ayala is a 83 y.o. female with history of atrial fibrillation on Eliquis, hyperlipidemia, hypertension, stroke presents to ED reporting left hip and left shoulder pain after a witnessed fall 5 days ago. Patient's niece at bedside provides most of the history. Reportedly patient was in the dining room at her retirement home when she was transitioning from her rolling walker to her chair, patient missed her chair and fell backwards landing on her buttocks. Per staff at retirement home patient did not have head trauma and did not lose consciousness after fall. Patient has been ambulating with her walker since the fall, but has been walking slower dragging her left foot. Patient's niece tells me that patient has been complaining of dizziness since the fall.  Patient was able to tell me her last name, and she was aware she was in the hospital. Was not oriented to year, month or day. Patient denied preceding chest pain, palpitations, shortness of breath, lightheadedness before her fall. Patient denies head trauma after fall. She denies headache, blurred vision, weakness, numbness.   HPI  Past Medical History:  Diagnosis Date  . Age-related physical debility   . Alzheimer's disease with early onset   . Atrial fibrillation (HCC)   . Essential hypertension   . Hemiplegia and hemiparesis following cerebral infarction affecting left non-dominant side (HCC)   . Hyperlipemia   . Hypertension   . Hyperthyroidism   . Hyperthyroidism   . Hypothyroidism   . Stroke [facility])     Patient Active Problem List   Diagnosis Date Noted  . Chronic anticoagulation 01/29/2016  . Essential hypertension 01/29/2016  . Dementia with behavioral disturbance   . HLD (hyperlipidemia)   . Hypothyroidism 10/17/2015  .  Atrial fibrillation (HCC) 10/17/2015  . Acute right MCA stroke (HCC) 10/16/2015    History reviewed. No pertinent surgical history.  OB History    No data available       Home Medications    Prior to Admission medications   Medication Sig Start Date End Date Taking? Authorizing Provider  acetaminophen (TYLENOL) 500 MG tablet Take 500 mg by mouth 3 (three) times daily.    Yes Historical Provider, MD  amLODipine (NORVASC) 2.5 MG tablet Take 2.5 mg by mouth daily.   Yes Historical Provider, MD  apixaban (ELIQUIS) 2.5 MG TABS tablet Take 1 tablet (2.5 mg total) by mouth 2 (two) times daily. 10/20/15  Yes Starleen Arms, MD  atorvastatin (LIPITOR) 20 MG tablet Take 1 tablet (20 mg total) by mouth daily at 6 PM. 10/20/15  Yes Starleen Arms, MD  levothyroxine (SYNTHROID, LEVOTHROID) 125 MCG tablet Take 0.5 tablets (62.5 mcg total) by mouth daily before breakfast. 10/20/15  Yes Starleen Arms, MD  sennosides-docusate sodium (SENOKOT-S) 8.6-50 MG tablet Take 1 tablet by mouth at bedtime.   Yes Historical Provider, MD  UNABLE TO FIND Take 1 each by mouth daily. Med Name: Mighty Shake   Yes Historical Provider, MD  amLODipine (NORVASC) 10 MG tablet Take 1 tablet (10 mg total) by mouth daily. Patient not taking: Reported on 01/05/2017 10/21/15   Starleen Arms, MD    Family History History reviewed. No pertinent family history.  Social History Social History  Substance Use Topics  . Smoking status:  Never Smoker  . Smokeless tobacco: Never Used  . Alcohol use No     Allergies   Codeine; Demerol [meperidine]; and Vioxx [rofecoxib]   Review of Systems Review of Systems  Constitutional: Negative for appetite change, chills and fever.  HENT: Negative for congestion and sore throat.   Eyes: Negative for photophobia and visual disturbance.  Respiratory: Negative for cough, choking, chest tightness and shortness of breath.   Cardiovascular: Negative for chest pain,  palpitations and leg swelling.  Gastrointestinal: Negative for abdominal pain, constipation, diarrhea, nausea and vomiting.  Genitourinary: Negative for difficulty urinating and hematuria.  Musculoskeletal: Positive for arthralgias, back pain and gait problem.  Skin: Negative for rash and wound.  Neurological: Positive for dizziness. Negative for seizures, syncope, weakness, light-headedness, numbness and headaches.  Hematological: Does not bruise/bleed easily.  Psychiatric/Behavioral: Negative.      Physical Exam Updated Vital Signs BP (!) 199/76 (BP Location: Left Arm)   Pulse (!) 44   Temp 98 F (36.7 C) (Oral)   Resp 20   Ht 5' 3.5" (1.613 m)   Wt 54.4 kg   SpO2 97%   BMI 20.92 kg/m   Physical Exam  Constitutional: She is oriented to person, place, and time. Vital signs are normal. She appears well-developed and well-nourished.  Non-toxic appearance. She does not have a sickly appearance.  HENT:  Head: Normocephalic and atraumatic.  Nose: No sinus tenderness or nasal deformity.  Mouth/Throat: Uvula is midline, oropharynx is clear and moist and mucous membranes are normal. No uvula swelling. No oropharyngeal exudate.  Eyes: Conjunctivae, EOM and lids are normal. Pupils are equal, round, and reactive to light. Right conjunctiva is not injected. Left conjunctiva is not injected.  Neck: Trachea normal, normal range of motion and full passive range of motion without pain. Neck supple. No JVD present. No spinous process tenderness present.  Cardiovascular: Normal rate, normal heart sounds and intact distal pulses.  An irregularly irregular rhythm present.  No murmur heard. Pulses:      Carotid pulses are 2+ on the right side, and 2+ on the left side.      Dorsalis pedis pulses are 2+ on the right side, and 2+ on the left side.  Pulmonary/Chest: Effort normal and breath sounds normal. No respiratory distress. She has no decreased breath sounds. She has no wheezes. She has no rales.  She exhibits no tenderness.  Abdominal: Soft. Bowel sounds are normal. She exhibits no distension and no mass. There is no tenderness. There is no rebound and no guarding.  Musculoskeletal: She exhibits no deformity.       Left shoulder: She exhibits tenderness, bony tenderness and pain. She exhibits normal range of motion, no swelling, no effusion, no crepitus and no deformity.       Left hip: She exhibits decreased range of motion, decreased strength and tenderness. She exhibits no bony tenderness, no swelling, no crepitus and no deformity.  No midline CTL spine tenderness.  No CTL paraspinal tenderness or increased tone.   LEFT LOWER EXTREMITY Left SI joint non tender Decreased passive LEFT hip flexion, abduction, adduction secondary to pain of   Full passive ROM of left knee, ankle and toes Positive Stinchfield test. Pain with left left internal/external rotation Negative SLR.  No left leg external rotation  Lymphadenopathy:    She has no cervical adenopathy.  Neurological: She is alert and oriented to person, place, and time. No sensory deficit.  Skin: Skin is warm and dry. Capillary refill takes less  than 2 seconds.  Psychiatric: She has a normal mood and affect. Her behavior is normal. Judgment and thought content normal.  Nursing note and vitals reviewed.    ED Treatments / Results  Labs (all labs ordered are listed, but only abnormal results are displayed) Labs Reviewed  BASIC METABOLIC PANEL - Abnormal; Notable for the following:       Result Value   Glucose, Bld 113 (*)    All other components within normal limits  CBC WITH DIFFERENTIAL/PLATELET  URINALYSIS, ROUTINE W REFLEX MICROSCOPIC  I-STAT TROPOININ, ED  POC OCCULT BLOOD, ED  POC OCCULT BLOOD, ED    EKG  EKG Interpretation  Date/Time:  Thursday January 05 2017 15:10:01 EDT Ventricular Rate:  47 PR Interval:    QRS Duration: 119 QT Interval:  494 QTC Calculation: 437 R Axis:   72 Text Interpretation:   Atrial fibrillation Incomplete right bundle branch block Low voltage, extremity and precordial lneads No significant change since last tracing Confirmed by Ethelda ChickJACUBOWITZ  MD, SAM [phone]([id number]) on 01/05/2017 3:25:24 PM       Radiology Dg Chest 2 View  Result Date: 01/05/2017 CLINICAL DATA:  83 year old female status post witnessed fall 5 days ago with continued left hip pain. On Eliquis. Initial encounter. EXAM: CHEST  2 VIEW COMPARISON:  10/17/2015. FINDINGS: Chronic moderate to severe cardiomegaly. Cardiac size appears stable since 2016, but there is new veiling opacity throughout the left lung base obscuring the hemidiaphragm. No pulmonary edema. Pulmonary vascularity is normal. No right pleural effusion. No pneumothorax or other confluent pulmonary opacity. Osteopenia. Progressed but nonacute appearing mid and upper thoracic spine compression fractures when compared to 2016. Calcified aortic atherosclerosis. No acute osseous abnormality identified. IMPRESSION: 1. Chronic moderate to severe cardiomegaly with no pulmonary edema but new moderate left side pleural effusion since 2016. 2. Osteopenia with progressed but chronic appearing thoracic compression fractures. 3.  Calcified aortic atherosclerosis. Electronically Signed   By: Odessa FlemingH  Hall M.D.   On: 01/05/2017 16:28   Ct Head Wo Contrast  Result Date: 01/05/2017 CLINICAL DATA:  Patient fell 5 days ago while in the dining room at the retirement home. Patient's niece reports that the staff said she just sat down on the floor. Patient continues to c/o left hip pain and niece reports that the patient drags her left leg and has outward rotation of the left foot. Patient is unable to bear weight. Patient normally walks with a walker. EXAM: CT HEAD WITHOUT CONTRAST CT CERVICAL SPINE WITHOUT CONTRAST TECHNIQUE: Multidetector CT imaging of the head and cervical spine was performed following the standard protocol without intravenous contrast. Multiplanar CT image  reconstructions of the cervical spine were also generated. COMPARISON:  03/01/2016 FINDINGS: CT HEAD FINDINGS Brain: The ventricles normal configuration. There is ventricular sulcal enlargement reflecting age-appropriate volume loss. No hydrocephalus. There are no parenchymal masses or mass effect. There is no evidence of an infarct. Patchy white matter hypoattenuation is noted consistent with mild to moderate chronic microvascular ischemic change. There are no extra-axial masses or abnormal fluid collections. There is no intracranial hemorrhage. Vascular: No hyperdense vessel or unexpected calcification. Skull: Normal. Negative for fracture or focal lesion. Sinuses/Orbits: Globes and orbits are unremarkable. Visualized sinuses and mastoid air cells are clear. Other: None. CT CERVICAL SPINE FINDINGS Alignment: Straightened cervical lordosis.  No spondylolisthesis. Skull base and vertebrae: No acute fracture. No primary bone lesion or focal pathologic process. Soft tissues and spinal canal: No prevertebral fluid or swelling. No visible canal hematoma. Disc levels:  Moderate loss of disc height at C5-C6 and C6-C7. Diffuse spondylotic disc bulging noted at C3-C4 through C6-C7. No convincing disc herniation. Facet degenerative changes noted most evident on the right at C4-C5. Uncovertebral spurring causes mild neural foraminal narrowing on the right at C6-C7. Bones are diffusely demineralized. Upper chest: Mild apical lung scarring.  No acute findings. Other: None IMPRESSION: HEAD CT 1. No acute intracranial abnormalities.  No skull fracture. 2. Age related volume loss and mild to moderate chronic microvascular ischemic change. CERVICAL CT 1. No fracture or acute finding. Electronically Signed   By: Amie Portland M.D.   On: 01/05/2017 16:36   Ct Cervical Spine Wo Contrast  Result Date: 01/05/2017 CLINICAL DATA:  Patient fell 5 days ago while in the dining room at the retirement home. Patient's niece reports that the  staff said she just sat down on the floor. Patient continues to c/o left hip pain and niece reports that the patient drags her left leg and has outward rotation of the left foot. Patient is unable to bear weight. Patient normally walks with a walker. EXAM: CT HEAD WITHOUT CONTRAST CT CERVICAL SPINE WITHOUT CONTRAST TECHNIQUE: Multidetector CT imaging of the head and cervical spine was performed following the standard protocol without intravenous contrast. Multiplanar CT image reconstructions of the cervical spine were also generated. COMPARISON:  03/01/2016 FINDINGS: CT HEAD FINDINGS Brain: The ventricles normal configuration. There is ventricular sulcal enlargement reflecting age-appropriate volume loss. No hydrocephalus. There are no parenchymal masses or mass effect. There is no evidence of an infarct. Patchy white matter hypoattenuation is noted consistent with mild to moderate chronic microvascular ischemic change. There are no extra-axial masses or abnormal fluid collections. There is no intracranial hemorrhage. Vascular: No hyperdense vessel or unexpected calcification. Skull: Normal. Negative for fracture or focal lesion. Sinuses/Orbits: Globes and orbits are unremarkable. Visualized sinuses and mastoid air cells are clear. Other: None. CT CERVICAL SPINE FINDINGS Alignment: Straightened cervical lordosis.  No spondylolisthesis. Skull base and vertebrae: No acute fracture. No primary bone lesion or focal pathologic process. Soft tissues and spinal canal: No prevertebral fluid or swelling. No visible canal hematoma. Disc levels: Moderate loss of disc height at C5-C6 and C6-C7. Diffuse spondylotic disc bulging noted at C3-C4 through C6-C7. No convincing disc herniation. Facet degenerative changes noted most evident on the right at C4-C5. Uncovertebral spurring causes mild neural foraminal narrowing on the right at C6-C7. Bones are diffusely demineralized. Upper chest: Mild apical lung scarring.  No acute  findings. Other: None IMPRESSION: HEAD CT 1. No acute intracranial abnormalities.  No skull fracture. 2. Age related volume loss and mild to moderate chronic microvascular ischemic change. CERVICAL CT 1. No fracture or acute finding. Electronically Signed   By: Amie Portland M.D.   On: 01/05/2017 16:36   Ct Hip Left Wo Contrast  Result Date: 01/05/2017 CLINICAL DATA:  Patient fell 5 days ago. Continued left hip pain with inability to bear weight. EXAM: CT OF THE LEFT HIP WITHOUT CONTRAST TECHNIQUE: Multidetector CT imaging of the left hip was performed according to the standard protocol. Multiplanar CT image reconstructions were also generated. COMPARISON:  Left hip radiographs 01/05/2017 FINDINGS: Bones/Joint/Cartilage Diffuse bone demineralization. Degenerative changes in the left hip with narrowing of the acetabular joint and hypertrophic changes on both sides of the joint. Femoral head appears intact. Focal cortical step-off demonstrated in the greater trochanter of the left hip without significant displacement. This is consistent with a nondisplaced fracture involving the greater trochanter. No evidence of  involvement of the femoral neck or lesser trochanteric region. No dislocation at the hip joint. Visualized left hemipelvis appears intact. Ligaments Suboptimally assessed by CT. Muscles and Tendons Mild muscular atrophy. No significant muscular infiltration or mass identified. Soft tissues 1.9 x 4.7 cm fluid collection around the greater trochanter may represent trochanteric bursitis or hematoma. Small left hip effusion. Vascular calcifications. Visualized pelvic organs appear intact. IMPRESSION: Cortical irregularities along the greater trochanter of the left hip suggesting nondisplaced fracture. No dislocation of the hip joint. Degenerative changes in the left hip. Fluid collection about the greater trochanter may represent trochanteric bursitis or hematoma. Electronically Signed   By: Burman Nieves  M.D.   On: 01/05/2017 21:55   Dg Shoulder Left  Result Date: 01/05/2017 CLINICAL DATA:  Acute left shoulder pain following fall 5 days ago. Initial encounter. EXAM: LEFT SHOULDER - 2+ VIEW COMPARISON:  10/17/2015 chest radiograph FINDINGS: No acute fracture, subluxation or dislocation identified. Mild degenerative changes at the glenohumeral joint noted. No focal bony lesions are present. IMPRESSION: No acute abnormality. Electronically Signed   By: Harmon Pier M.D.   On: 01/05/2017 16:25   Dg Hip Unilat With Pelvis 2-3 Views Left  Result Date: 01/05/2017 CLINICAL DATA:  83 year old female status post witnessed fall 5 days ago with continued left hip pain. On Eliquis. Initial encounter. EXAM: DG HIP (WITH OR WITHOUT PELVIS) 2-3V LEFT COMPARISON:  KUB 10/17/2015 FINDINGS: Bone mineralization is within normal limits for age. The femoral heads are normally located. Hip joint spaces appear stable since 2016 and normal for age. No fracture of the pelvis identified. Grossly intact proximal right femur. The proximal left femur appears intact. Degenerative changes at the pubic symphysis. Femoral artery calcified atherosclerosis is mild for age. Negative visible bowel gas pattern. IMPRESSION: No acute fracture or dislocation identified about the left hip or pelvis. If occult hip fracture is suspected or if the patient is unable to weightbear, MRI is the preferred modality for further evaluation. Electronically Signed   By: Odessa Fleming M.D.   On: 01/05/2017 16:26    Procedures Procedures (including critical care time)  Medications Ordered in ED Medications  acetaminophen (TYLENOL) tablet 500 mg (500 mg Oral Given 01/05/17 2207)  sodium chloride 0.9 % bolus 1,000 mL (not administered)     Initial Impression / Assessment and Plan / ED Course  I have reviewed the triage vital signs and the nursing notes.  Pertinent labs & imaging results that were available during my care of the patient were reviewed by me  and considered in my medical decision making (see chart for details).  Clinical Course as of Jan 05 2226  Thu Jan 05, 2017  1621 Troponin i, poc: 0.00 [CG]  1621 WBC: 7.3 [CG]  1621 Hemoglobin: 13.0 [CG]  1621 Sodium: 137 [CG]  1621 Potassium: 4.3 [CG]  1621 Chloride: 103 [CG]  1621 Glucose: (!) 113 [CG]  1621 Creatinine: 0.78 [CG]  1659 HEAD CT 1. No acute intracranial abnormalities. No skull fracture. 2. Age related volume loss and mild to moderate chronic microvascular ischemic change.  CERVICAL CT 1. No fracture or acute finding. CT Head Wo Contrast [CG]  1700 IMPRESSION: 1. Chronic moderate to severe cardiomegaly with no pulmonary edema but new moderate left side pleural effusion since 2016. 2. Osteopenia with progressed but chronic appearing thoracic compression fractures. 3. Calcified aortic atherosclerosis. DG Chest 2 View [CG]  1700 IMPRESSION: No acute fracture or dislocation identified about the left hip or pelvis. If occult hip  fracture is suspected or if the patient is unable to weightbear, MRI is the preferred modality for further evaluation. DG Hip Unilat With Pelvis 2-3 Views Left [CG]  1700 IMPRESSION: No acute abnormality. DG Shoulder Left [CG]  1701 Atrial fibrillation Incomplete right bundle branch block Low voltage, extremity and precordial lneads No significant change since last tracing EKG 12-Lead [CG]  1701 Nitrite: NEGATIVE [CG]  1701 Nitrite: NEGATIVE [CG]  2213 IMPRESSION: Cortical irregularities along the greater trochanter of the left hip suggesting nondisplaced fracture. No dislocation of the hip joint. Degenerative changes in the left hip. Fluid collection about the greater trochanter may represent trochanteric bursitis or hematoma. CT HIP LEFT WO CONTRAST [CG]    Clinical Course User Index [CG] Liberty Handy, PA-C   83 year old female with history of atrial fibrillation on Eliquis presents with left hip and left shoulder pain after  mechanical fall 5 days ago. Some positional lightheadedness with changing positions from sitting to standing or walking. No associated chest pain, shortness of breath, blurred vision.On exam vital signs are reassuring, systolic blood pressure elevated however patient has not taken her blood pressure medicines today. No obvious deformity in left lower extremity, there is no external rotation or shortening of the left leg. Positive Stinchfield test, pain worse with hip internal and external rotation. No ecchymosis or signs of trauma to the posterior left buttock. No neurological deficits. Rectal exam normal, good rectal tone.   X-ray hip negative, attempted MRI but patient did not tolerate MRI.  CT scan of left hip showed nondisplaced fracture to left greater trochanter.  Recommended ortho consult and possible surgical repair and admission tonight, patient and family members agreeable to this. Will place consult to ortho surgery for recommendations.   10:30PM: Spoke to Dr. Roda Shutters who recommends discharge with no restriction and WBAT with clinic f/u as needed.  Discussed plan to discharge with tylenol, WBAT.  Patient and family at bedside agreeable.  Patient, ED treatment and discharge plan was discussed with supervising physician who also evaluated the patient and is agreeable with plan.   Final Clinical Impressions(s) / ED Diagnoses   Final diagnoses:  Fall  Left hip pain    New Prescriptions New Prescriptions   No medications on file     Jerrell Mylar 01/05/17 2227    Doug Sou, MD 01/06/17 [id number]

## 2017-01-05 NOTE — ED Notes (Signed)
Patient requested no IV.

## 2017-01-05 NOTE — ED Triage Notes (Signed)
Patient fell 5 days ago while in the dining room at the retirement home. Patient's niece reports that the staff said she just sat down on the floor. Patient continues to c/o left hip pain and niece reports that the patient drags her left leg and has outward rotation of the left foot. Patient is unable to bear weight. Patient normally walks with a walker.

## 2017-01-05 NOTE — Discharge Instructions (Signed)
Your CT scan showed a nondisplaced greater trochanter fracture. We consulted orthopedic surgeon (Dr. Roda ShuttersXu) who recommends regular activity and weightbearing as tolerated, you do not need surgical repair.  Please increase your dose of Tylenol to control your pain.  You may take 1000 mg of tylenol three times a day for your pain. Avoid sitting down or laying down for prolonged periods of time, make sure that you're taking a few steps every 1-2 hours.  Please call orthopedic surgeon, Dr. Deno Etiennehu, for further evaluation and treatment of your pain as needed. We recommend physical therapy as this may help you recover more quickly. Contact your primary care provider if you would like to establish care with physical therapy.  Please use your walker at all times. Make sure that somebody is helping you walk as recover from your fracture. It is very important that you're very careful when ambulating to avoid another fall.

## 2017-01-12 ENCOUNTER — Ambulatory Visit (INDEPENDENT_AMBULATORY_CARE_PROVIDER_SITE_OTHER): Payer: Medicare Other

## 2017-01-12 ENCOUNTER — Encounter (INDEPENDENT_AMBULATORY_CARE_PROVIDER_SITE_OTHER): Payer: Self-pay | Admitting: Orthopaedic Surgery

## 2017-01-12 ENCOUNTER — Ambulatory Visit (INDEPENDENT_AMBULATORY_CARE_PROVIDER_SITE_OTHER): Payer: Medicare Other | Admitting: Orthopaedic Surgery

## 2017-01-12 DIAGNOSIS — S72112A Displaced fracture of greater trochanter of left femur, initial encounter for closed fracture: Secondary | ICD-10-CM | POA: Diagnosis not present

## 2017-01-12 NOTE — Progress Notes (Signed)
Office Visit Note   Patient: Jordan Ayala           Date of Birth: 1911/06/02           MRN: [id number] Visit Date: 01/12/2017              Requested by: Juluis RainierElizabeth Barnes, MD [address] [place], [place] [address] PCP: Gaye AlkenBARNES,ELIZABETH STEWART, MD   Assessment & Plan: Visit Diagnoses:  1. Closed displaced fracture of greater trochanter of left femur, initial encounter (HCC)     Plan: Repeat x-rays today show stable fracture without any propagation of the fracture. Continue with physical therapy immobilization. Tylenol as needed for pain. Follow-up in 6 weeks with 2 view x-rays of the left hip  Follow-Up Instructions: Return in about 6 weeks (around 02/23/2017).   Orders:  Orders Placed This Encounter  Procedures  . XR Pelvis 1-2 Views   No orders of the defined types were placed in this encounter.     Procedures: No procedures performed   Clinical Data: No additional findings.   Subjective: Chief Complaint  Patient presents with  . Left Hip - Pain    Patient is a 83 year old female who sustained a nondisplaced left greater trochanter fracture about 10 days ago. She is currently a nursing home. She is having significant pain with physical therapy immobilization.    Review of Systems  Constitutional: Negative.   HENT: Negative.   Eyes: Negative.   Respiratory: Negative.   Cardiovascular: Negative.   Endocrine: Negative.   Musculoskeletal: Negative.   Neurological: Negative.   Hematological: Negative.   Psychiatric/Behavioral: Negative.   All other systems reviewed and are negative.    Objective: Vital Signs: There were no vitals taken for this visit.  Physical Exam  Constitutional: She is oriented to person, place, and time. She appears well-developed and well-nourished.  HENT:  Head: Normocephalic and atraumatic.  Eyes: EOM are normal.  Neck: Neck supple.  Pulmonary/Chest: Effort normal.  Abdominal: Soft.  Neurological: She is alert  and oriented to person, place, and time.  Skin: Skin is warm. Capillary refill takes less than 2 seconds.  Psychiatric: She has a normal mood and affect. Her behavior is normal. Judgment and thought content normal.  Nursing note and vitals reviewed.   Ortho Exam Left hip exam shows tenderness over the greater trochanter. Moderate pain with range of motion of the hip that localizes to the lateral aspect. Specialty Comments:  No specialty comments available.  Imaging: No results found.   PMFS History: Patient Active Problem List   Diagnosis Date Noted  . Closed displaced fracture of greater trochanter of left femur (HCC) 01/12/2017  . Chronic anticoagulation 01/29/2016  . Essential hypertension 01/29/2016  . Dementia with behavioral disturbance   . HLD (hyperlipidemia)   . Hypothyroidism 10/17/2015  . Atrial fibrillation (HCC) 10/17/2015  . Acute right MCA stroke (HCC) 10/16/2015   Past Medical History:  Diagnosis Date  . Age-related physical debility   . Alzheimer's disease with early onset   . Atrial fibrillation (HCC)   . Essential hypertension   . Hemiplegia and hemiparesis following cerebral infarction affecting left non-dominant side (HCC)   . Hyperlipemia   . Hypertension   . Hyperthyroidism   . Hyperthyroidism   . Hypothyroidism   . Stroke [facility](HCC)     No family history on file.  No past surgical history on file. Social History   Occupational History  . Not on file.   Social History Main Topics  .  Smoking status: Never Smoker  . Smokeless tobacco: Never Used  . Alcohol use No  . Drug use: No  . Sexual activity: Not on file

## 2017-02-23 ENCOUNTER — Ambulatory Visit (INDEPENDENT_AMBULATORY_CARE_PROVIDER_SITE_OTHER): Payer: Medicare Other | Admitting: Orthopaedic Surgery

## 2018-03-11 ENCOUNTER — Emergency Department (HOSPITAL_COMMUNITY)
Admission: EM | Admit: 2018-03-11 | Discharge: 2018-03-12 | Disposition: A | Discharge status: Home / Self Care | Payer: Medicare Other | Attending: Emergency Medicine | Admitting: Emergency Medicine

## 2018-03-11 ENCOUNTER — Other Ambulatory Visit: Payer: Self-pay

## 2018-03-11 ENCOUNTER — Encounter (HOSPITAL_COMMUNITY): Payer: Self-pay | Admitting: Emergency Medicine

## 2018-03-11 DIAGNOSIS — Z79899 Other long term (current) drug therapy: Secondary | ICD-10-CM | POA: Insufficient documentation

## 2018-03-11 DIAGNOSIS — Z7901 Long term (current) use of anticoagulants: Secondary | ICD-10-CM | POA: Diagnosis not present

## 2018-03-11 DIAGNOSIS — G300 Alzheimer's disease with early onset: Secondary | ICD-10-CM | POA: Insufficient documentation

## 2018-03-11 DIAGNOSIS — E039 Hypothyroidism, unspecified: Secondary | ICD-10-CM | POA: Diagnosis not present

## 2018-03-11 DIAGNOSIS — Z043 Encounter for examination and observation following other accident: Secondary | ICD-10-CM | POA: Diagnosis present

## 2018-03-11 DIAGNOSIS — I10 Essential (primary) hypertension: Secondary | ICD-10-CM | POA: Insufficient documentation

## 2018-03-11 DIAGNOSIS — W010XXA Fall on same level from slipping, tripping and stumbling without subsequent striking against object, initial encounter: Secondary | ICD-10-CM | POA: Insufficient documentation

## 2018-03-11 DIAGNOSIS — Z8679 Personal history of other diseases of the circulatory system: Secondary | ICD-10-CM | POA: Insufficient documentation

## 2018-03-11 DIAGNOSIS — F0280 Dementia in other diseases classified elsewhere without behavioral disturbance: Secondary | ICD-10-CM | POA: Insufficient documentation

## 2018-03-11 NOTE — Discharge Instructions (Addendum)
It was our pleasure to provide your ER care today.  Given your recent fall, contact your doctor tomorrow to discuss whether they feel the benefit of continued blood thinner therapy (eliquis) exceeds the potential risk.  Return to ER if worse, new symptoms, new or severe pain, severe headache, other concern.

## 2018-03-11 NOTE — ED Provider Notes (Signed)
[facility]-EMERGENCY DEPT Provider Note   CSN: [id number] Arrival date & time: 03/11/18  2210     History   Chief Complaint Chief Complaint  Patient presents with  . Fall    HPI Virgene Tirone is a 84 y.o. female.  Patient s/p fall at ecf. Was noted in front of chair, alert. Pt states foot got caught on leg. Denies loc. No headache. Denies contusion/injury to head. No neck or back pain. Denies extremity injury. No nausea or vomiting. Patient states does not feel sick or ill.   The history is provided by the patient.  Fall  Pertinent negatives include no chest pain, no abdominal pain, no headaches and no shortness of breath.    Past Medical History:  Diagnosis Date  . Age-related physical debility   . Alzheimer's disease with early onset   . Atrial fibrillation (HCC)   . Essential hypertension   . Hemiplegia and hemiparesis following cerebral infarction affecting left non-dominant side (HCC)   . Hyperlipemia   . Hypertension   . Hyperthyroidism   . Hyperthyroidism   . Hypothyroidism   . Stroke [facility])     Patient Active Problem List   Diagnosis Date Noted  . Closed displaced fracture of greater trochanter of left femur (HCC) 01/12/2017  . Chronic anticoagulation 01/29/2016  . Essential hypertension 01/29/2016  . Dementia with behavioral disturbance   . HLD (hyperlipidemia)   . Hypothyroidism 10/17/2015  . Atrial fibrillation (HCC) 10/17/2015  . Acute right MCA stroke (HCC) 10/16/2015    History reviewed. No pertinent surgical history.   OB History   None      Home Medications    Prior to Admission medications   Medication Sig Start Date End Date Taking? Authorizing Provider  acetaminophen (TYLENOL) 500 MG tablet Take 2 tablets (1,000 mg total) by mouth 3 (three) times daily. 01/05/17   Liberty Handy, PA-C  amLODipine (NORVASC) 10 MG tablet Take 1 tablet (10 mg total) by mouth daily. Patient not taking: Reported on 01/05/2017  10/21/15   Elgergawy, Leana Roe, MD  amLODipine (NORVASC) 2.5 MG tablet Take 2.5 mg by mouth daily.    [provider]  apixaban (ELIQUIS) 2.5 MG TABS tablet Take 1 tablet (2.5 mg total) by mouth 2 (two) times daily. 10/20/15   Elgergawy, Leana Roe, MD  atorvastatin (LIPITOR) 20 MG tablet Take 1 tablet (20 mg total) by mouth daily at 6 PM. 10/20/15   Elgergawy, Leana Roe, MD  levothyroxine (SYNTHROID, LEVOTHROID) 125 MCG tablet Take 0.5 tablets (62.5 mcg total) by mouth daily before breakfast. 10/20/15   Elgergawy, Leana Roe, MD  sennosides-docusate sodium (SENOKOT-S) 8.6-50 MG tablet Take 1 tablet by mouth at bedtime.    [provider]  UNABLE TO FIND Take 1 each by mouth daily. Med Name: [facility]    [provider]    Family History No family history on file.  Social History Social History   Tobacco Use  . Smoking status: Never Smoker  . Smokeless tobacco: Never Used  Substance Use Topics  . Alcohol use: No  . Drug use: No     Allergies   Codeine; Demerol [meperidine]; and Vioxx [rofecoxib]   Review of Systems Review of Systems  Constitutional: Negative for fever.  HENT: Negative for sore throat.   Eyes: Negative for pain.  Respiratory: Negative for shortness of breath.   Cardiovascular: Negative for chest pain.  Gastrointestinal: Negative for abdominal pain, diarrhea and vomiting.  Genitourinary: Negative for  flank pain.  Musculoskeletal: Negative for back pain and neck pain.  Skin: Negative for wound.  Neurological: Negative for headaches.  Hematological: Does not bruise/bleed easily.  Psychiatric/Behavioral: The patient is not nervous/anxious.      Physical Exam Updated Vital Signs BP (!) 161/70 (BP Location: Left Arm)   Pulse (!) 59   Temp 98.9 F (37.2 C) (Oral)   Resp 15   SpO2 96%   Physical Exam  Constitutional: She appears well-developed and well-nourished. No distress.  HENT:  Head: Atraumatic.  No head or scalp sts,  bruising, or tenderness.   Eyes: Pupils are equal, round, and reactive to light. Conjunctivae are normal. No scleral icterus.  Neck: Neck supple. No tracheal deviation present.  Cardiovascular: Normal rate, normal heart sounds and intact distal pulses.  Pulmonary/Chest: Effort normal and breath sounds normal. No respiratory distress. She exhibits no tenderness.  Abdominal: Soft. Normal appearance. She exhibits no distension. There is no tenderness.  Musculoskeletal: She exhibits no edema or tenderness.  CTLS spine, non tender, aligned, no step off. Good rom bilateral extremities without pain or focal bony tenderness.   Neurological: She is alert.  Speech clear/fluent. Motor intact bil, stre 5/5. sens grossly intact.   Skin: Skin is warm and dry. No rash noted. She is not diaphoretic.  Psychiatric: She has a normal mood and affect.  Nursing note and vitals reviewed.    ED Treatments / Results  Labs (all labs ordered are listed, but only abnormal results are displayed) Labs Reviewed - No data to display  EKG None  Radiology No results found.  Procedures Procedures (including critical care time)  Medications Ordered in ED Medications - No data to display   Initial Impression / Assessment and Plan / ED Course  I have reviewed the triage vital signs and the nursing notes.  Pertinent labs & imaging results that were available during my care of the patient were reviewed by me and considered in my medical decision making (see chart for details).  Patient denies pain.   No head/face/scap contusion or other sign of injury.  Pt remains alert, mental status c/w baseline.   Given fall, ?whether continued benefit of anticoag therapy outweighs risk - rec f/u pcp tomorrow to discuss meds and whether to continue anticoag tx.   Patient currently asymptomatic, and appears stable for d/c.     Final Clinical Impressions(s) / ED Diagnoses   Final diagnoses:  None    ED Discharge  Orders    None       Cathren Laine, MD 03/11/18 2244

## 2018-03-11 NOTE — ED Triage Notes (Addendum)
Pt BIB EMS from [facility] s/p fall. Patient found laying in front of chair. No bruises noted, no hematomas, no bruising or knots noted to head. Patient unsure of what happened, hx dementia. Per EMS, patient slid from chair to floor, no objects around for patient to hit. Patient has chronic back pain which is exacerbated by the fall. Patient on blood thinners.

## 2018-03-11 NOTE — ED Notes (Signed)
[facility] called for pt 

## 2018-03-11 NOTE — ED Notes (Signed)
Bed: ZO10 Expected date:  Expected time:  Means of arrival:  Comments: 89 f fall

## 2018-03-12 NOTE — ED Notes (Signed)
Bed: WHALB Expected date:  Expected time:  Means of arrival:  Comments: 

## 2018-03-18 ENCOUNTER — Emergency Department (HOSPITAL_COMMUNITY): Payer: Medicare Other

## 2018-03-18 ENCOUNTER — Emergency Department (HOSPITAL_COMMUNITY)
Admission: EM | Admit: 2018-03-18 | Discharge: 2018-03-18 | Disposition: A | Discharge status: Home / Self Care | Payer: Medicare Other | Attending: Emergency Medicine | Admitting: Emergency Medicine

## 2018-03-18 ENCOUNTER — Encounter (HOSPITAL_COMMUNITY): Payer: Self-pay | Admitting: Emergency Medicine

## 2018-03-18 DIAGNOSIS — Z7901 Long term (current) use of anticoagulants: Secondary | ICD-10-CM | POA: Diagnosis not present

## 2018-03-18 DIAGNOSIS — W19XXXA Unspecified fall, initial encounter: Secondary | ICD-10-CM

## 2018-03-18 DIAGNOSIS — Z8673 Personal history of transient ischemic attack (TIA), and cerebral infarction without residual deficits: Secondary | ICD-10-CM | POA: Diagnosis not present

## 2018-03-18 DIAGNOSIS — F0390 Unspecified dementia without behavioral disturbance: Secondary | ICD-10-CM | POA: Diagnosis not present

## 2018-03-18 DIAGNOSIS — M25552 Pain in left hip: Secondary | ICD-10-CM | POA: Diagnosis not present

## 2018-03-18 DIAGNOSIS — Z79899 Other long term (current) drug therapy: Secondary | ICD-10-CM | POA: Diagnosis not present

## 2018-03-18 DIAGNOSIS — E039 Hypothyroidism, unspecified: Secondary | ICD-10-CM | POA: Diagnosis not present

## 2018-03-18 DIAGNOSIS — I10 Essential (primary) hypertension: Secondary | ICD-10-CM | POA: Insufficient documentation

## 2018-03-18 DIAGNOSIS — M545 Low back pain, unspecified: Secondary | ICD-10-CM

## 2018-03-18 MED ORDER — MORPHINE SULFATE 15 MG PO TABS: 7.5000 mg | ORAL_TABLET | Freq: Four times a day (QID) | ORAL | 0 refills | Status: AC | PRN

## 2018-03-18 NOTE — ED Notes (Signed)
Bed: WA21 Expected date:  Expected time:  Means of arrival:  Comments: 

## 2018-03-18 NOTE — ED Provider Notes (Signed)
[facility]-EMERGENCY DEPT Provider Note   CSN: [id number] Arrival date & time: 03/18/18  1114     History   Chief Complaint Chief Complaint  Patient presents with  . Fall  . Hip Pain  . Back Pain    HPI Jordan Ayala is a 84 y.o. female.  The history is provided by the patient. No language interpreter was used.  Fall   Hip Pain   Back Pain      Jordan Ayala is a 84 y.o. female who presents to the Emergency Department complaining of fall. Level V caveat due to dementia. She presents to the emergency department via EMS for evaluation of injuries following a fall yesterday. She was found on the ground yesterday at her nursing facility and she was placed back in the bed. Today she has complained of left hip pain, which was being treated with PRN tramadol. She had ongoing pain despite medications and EMS was called for transfer to the hospital. Fall was unwitnessed, no known head injury or loss of consciousness. Patient reports pain to her back, no additional complaints.  Past Medical History:  Diagnosis Date  . Age-related physical debility   . Alzheimer's disease with early onset   . Atrial fibrillation (HCC)   . Essential hypertension   . Hemiplegia and hemiparesis following cerebral infarction affecting left non-dominant side (HCC)   . Hyperlipemia   . Hypertension   . Hyperthyroidism   . Hyperthyroidism   . Hypothyroidism   . Stroke [facility])     Patient Active Problem List   Diagnosis Date Noted  . Closed displaced fracture of greater trochanter of left femur (HCC) 01/12/2017  . Chronic anticoagulation 01/29/2016  . Essential hypertension 01/29/2016  . Dementia with behavioral disturbance   . HLD (hyperlipidemia)   . Hypothyroidism 10/17/2015  . Atrial fibrillation (HCC) 10/17/2015  . Acute right MCA stroke (HCC) 10/16/2015    History reviewed. No pertinent surgical history.   OB History   None      Home Medications     Prior to Admission medications   Medication Sig Start Date End Date Taking? Authorizing Provider  acetaminophen (TYLENOL) 500 MG tablet Take 2 tablets (1,000 mg total) by mouth 3 (three) times daily. 01/05/17  Yes Sharen Heck J, PA-C  amLODipine (NORVASC) 2.5 MG tablet Take 2.5 mg by mouth daily.   Yes [provider]  apixaban (ELIQUIS) 2.5 MG TABS tablet Take 1 tablet (2.5 mg total) by mouth 2 (two) times daily. 10/20/15  Yes Elgergawy, Leana Roe, MD  atorvastatin (LIPITOR) 20 MG tablet Take 1 tablet (20 mg total) by mouth daily at 6 PM. 10/20/15  Yes Elgergawy, Leana Roe, MD  dextromethorphan-guaiFENesin (MUCINEX DM) 30-600 MG 12hr tablet Take 1 tablet by mouth 2 (two) times daily.   Yes [provider]  levothyroxine (SYNTHROID, LEVOTHROID) 125 MCG tablet Take 0.5 tablets (62.5 mcg total) by mouth daily before breakfast. 10/20/15  Yes Elgergawy, Leana Roe, MD  ondansetron (ZOFRAN) 4 MG tablet Take 4 mg by mouth every 8 (eight) hours as needed for nausea or vomiting.   Yes [provider]  sennosides-docusate sodium (SENOKOT-S) 8.6-50 MG tablet Take 1 tablet by mouth at bedtime.   Yes [provider]  UNABLE TO FIND Take 1 each by mouth daily. Med Name: [facility]   Yes [provider]  amLODipine (NORVASC) 10 MG tablet Take 1 tablet (10 mg total) by mouth daily. Patient not taking: Reported on 01/05/2017 10/21/15  Elgergawy, Leana Roe, MD  morphine (MSIR) 15 MG tablet Take 0.5-1 tablets (7.5-15 mg total) by mouth every 6 (six) hours as needed for severe pain. 03/18/18   Tilden Fossa, MD    Family History No family history on file.  Social History Social History   Tobacco Use  . Smoking status: Never Smoker  . Smokeless tobacco: Never Used  Substance Use Topics  . Alcohol use: No  . Drug use: No     Allergies   Codeine; Demerol [meperidine]; and Vioxx [rofecoxib]   Review of Systems Review of Systems  Musculoskeletal:  Positive for back pain.  All other systems reviewed and are negative.    Physical Exam Updated Vital Signs BP (!) 147/51 (BP Location: Right Arm)   Pulse (!) 51   Temp 98 F (36.7 C) (Oral)   Resp 18   SpO2 90%   Physical Exam  Constitutional: She appears well-developed and well-nourished.  HENT:  Head: Normocephalic and atraumatic.  Cardiovascular: Normal rate and regular rhythm.  No murmur heard. Pulmonary/Chest: Effort normal and breath sounds normal. No respiratory distress.  Abdominal: Soft. There is no tenderness. There is no rebound and no guarding.  Musculoskeletal: She exhibits no edema or tenderness.  No hip tenderness bilaterally, no tenderness in bilateral lower extremities with passive ROM.    Neurological: She is alert.  Confused, disoriented to place in time.  Left sided weakness.    Skin: Skin is warm and dry.  Psychiatric: She has a normal mood and affect. Her behavior is normal.  Nursing note and vitals reviewed.    ED Treatments / Results  Labs (all labs ordered are listed, but only abnormal results are displayed) Labs Reviewed - No data to display  EKG None  Radiology Ct Abdomen Pelvis Wo Contrast  Result Date: 03/18/2018 CLINICAL DATA:  Low back pain post fall. EXAM: CT ABDOMEN AND PELVIS WITHOUT CONTRAST TECHNIQUE: Multidetector CT imaging of the abdomen and pelvis was performed following the standard protocol without IV contrast. COMPARISON:  Lumbosacral spine radiograph 03/18/2018 FINDINGS: Lower chest: Small left and trace right pleural effusion. Left lung base atelectasis. Enlarged heart. Moderate pericardial effusion. Hepatobiliary: Normal appearance of the liver. Layering gallbladder sludge. No evidence of biliary ductal dilation. Pancreas: Unremarkable. No pancreatic ductal dilatation or surrounding inflammatory changes. Spleen: Splenic granulomata. Adrenals/Urinary Tract: Normal adrenal glands. No evidence of hydronephrosis or nephrolithiasis.  13 mm right renal cyst. Normal appearance of the urinary bladder. Stomach/Bowel: Stomach is within normal limits. Appendix appears normal. No evidence of bowel wall thickening, distention, or inflammatory changes. Scattered colonic diverticulosis without evidence of diverticulitis. Vascular/Lymphatic: Aortic atherosclerosis. No enlarged abdominal or pelvic lymph nodes. Reproductive: Status post hysterectomy. No adnexal masses. Other: None. Musculoskeletal: Right superior breast 2.4 cm mass. Osteopenia. Lumbosacral spine scoliosis. Multilevel moderate to severe osteoarthritic changes. Likely degenerative 5 mm anterolisthesis of L4 on L5. No acute fracture seen. Osteoarthritic changes of bilateral hips and pubic symphysis. IMPRESSION: No evidence of acute fracture.  Osteopenia. Advance lumbosacral spine scoliosis with likely degenerative anterolisthesis of L4 on L5. 2.4 cm right superior breast mass, concerning for breast cancer. Please correlate to mammography, if found clinically appropriate. Enlarged heart with moderate pericardial effusion. Small left and trace right pleural effusions. Mild left lung base atelectasis. Cholelithiasis without evidence of acute cholecystitis. Diverticulosis without evidence of diverticulitis. These results were called by telephone at the time of interpretation on 03/18/2018 at 2:23 pm to Dr. Tilden Fossa , who verbally acknowledged these results. Electronically Signed  By: Ted Mcalpine M.D.   On: 03/18/2018 14:25   Dg Lumbar Spine Complete  Result Date: 03/18/2018 CLINICAL DATA:  Low back pain following a fall yesterday. EXAM: LUMBAR SPINE - COMPLETE 4+ VIEW COMPARISON:  Abdomen dated 10/17/2015. FINDINGS: Five non-rib-bearing lumbar vertebrae with the last open disc space at the L5-S1 level. Facet degenerative changes in the mid and lower lumbar spine with associated grade 1-2 anterolisthesis at the L4-5 level. Marked disc space narrowing with vacuum phenomena and mild  anterior spur formation throughout the lumbar spine. There is also mild to moderate lateral spur formation at multiple levels of the lumbar spine and moderate lateral spur formation at multiple levels of the lower thoracic spine. No fractures or pars defects are seen. Atheromatous arterial calcifications. IMPRESSION: Degenerative changes, as described above without fracture or traumatic subluxation. Electronically Signed   By: Beckie Salts M.D.   On: 03/18/2018 12:42   Dg Hip Unilat W Or Wo Pelvis 2-3 Views Left  Result Date: 03/18/2018 CLINICAL DATA:  Fall yesterday with back and hip pain. EXAM: DG HIP (WITH OR WITHOUT PELVIS) 2-3V LEFT COMPARISON:  01/05/2017 FINDINGS: Chondrocalcinosis along the joint capsule laterally. The left hip is internally rotated compared to the right. Mild loss of articular space in both hips. Bony demineralization. I do not appreciate definite cortical discontinuity. No discrete fracture of the left hip is identified. Indistinct cortical margins on the cross-table lateral projection. IMPRESSION: 1. No discrete fracture. Bony demineralization mildly reduces diagnostic sensitivity, consider dedicated CT or MRI if there is a high clinical index of suspicion of occult fracture. 2. Chondrocalcinosis. Electronically Signed   By: Gaylyn Rong M.D.   On: 03/18/2018 12:40   Dg Hip Unilat W Or Wo Pelvis 2-3 Views Right  Result Date: 03/18/2018 CLINICAL DATA:  Fall yesterday.  Dementia.  Low back pain. EXAM: DG HIP (WITH OR WITHOUT PELVIS) 2-3V RIGHT COMPARISON:  Pelvic radiograph of 01/12/2017 FINDINGS: Two views of the right hip demonstrate chondrocalcinosis along the joint capsule laterally, as well as vascular calcifications in the region. No appreciable right hip fracture. Spurring along the right SI joint. Lateral projection blurred by motion. Bony demineralization. IMPRESSION: 1. No appreciable fracture. If there is a high clinical index of suspicion of occult fracture,  consider dedicated CT or MRI. 2. Chondrocalcinosis along the joint capsule. 3. Vascular calcifications. Electronically Signed   By: Gaylyn Rong M.D.   On: 03/18/2018 12:38    Procedures Procedures (including critical care time)  Medications Ordered in ED Medications - No data to display   Initial Impression / Assessment and Plan / ED Course  I have reviewed the triage vital signs and the nursing notes.  Pertinent labs & imaging results that were available during my care of the patient were reviewed by me and considered in my medical decision making (see chart for details).    1130 - attempted to contact patient's family member listed as emergency contact - no answer.  Patient here for pain following a fall - pt reports low back pain, per EMS and facility she had hip pain.  No evidence of acute fracture based on plain films.  CT obtained to eval for occult fracture.  CT neg for fracture but does note multiple incidental findings.  Discussed with patient's niece and POA findings of CT scan - family aware of breast mass.  Given advanced age, dementia family do not want additional testing or eval at this time.  Plan to d/c back to facility  for PCP follow up.  Family does request improved pain control.  Patient without pain in ED but she did receive fentanyl prior to arrival.  Will change tramadol to low dose morphine.  Plan to d/c home with PCP follow up and return precautions.    Final Clinical Impressions(s) / ED Diagnoses   Final diagnoses:  Fall, initial encounter  Acute low back pain without sciatica, unspecified back pain laterality    ED Discharge Orders        Ordered    morphine (MSIR) 15 MG tablet  Every 6 hours PRN     03/18/18 1551       Tilden Fossa, MD 03/18/18 1735

## 2018-03-18 NOTE — ED Notes (Signed)
Patient transported to X-ray 

## 2018-03-18 NOTE — Discharge Instructions (Addendum)
Jordan Ayala had a CT scan performed in the Emergency Department that had multiple incidental findings - please have her follow up with her family doctor for further management.  Discuss with her family doctor about stopping her eliquis.

## 2018-03-18 NOTE — ED Triage Notes (Addendum)
Patient here from [facility] with complaints of fall yesterday. Facility unable to manage pain. Hx of dementia. Reports lower back pain. Blood thinners. 50 Fent given .

## 2018-04-23 DEATH — deceased

## 2018-07-01 IMAGING — CT CT ABD-PELV W/O CM
2 of 4 series · 15 of 46 positions shown, 17 images · non-contrast
Comparison: Lumbosacral spine radiograph 03/18/2018

CLINICAL DATA: Low back pain post fall.

EXAM:
CT ABDOMEN AND PELVIS WITHOUT CONTRAST
TECHNIQUE: Multidetector CT imaging of the abdomen and pelvis was performed
following the standard protocol without IV contrast.

[Series 2: axial st · axial · 0.69mm/px · z∈[-649,-264]mm · 12 of 89 slices shown, 14 images]
[im 6/89  soft-tissue]
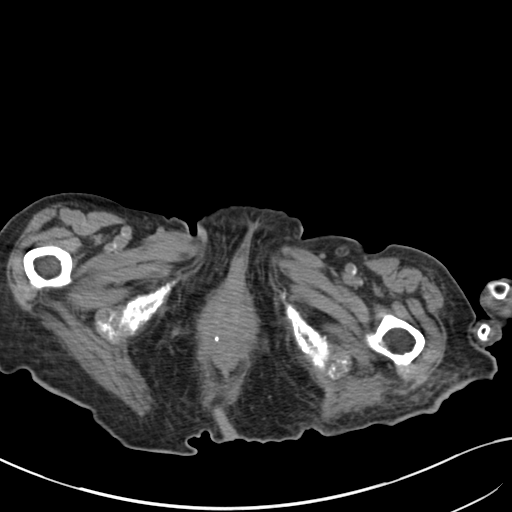
[im 6/89  bone]
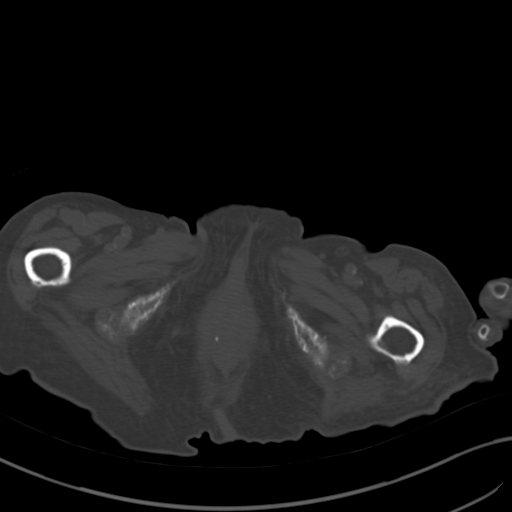
[im 12/89  soft-tissue]
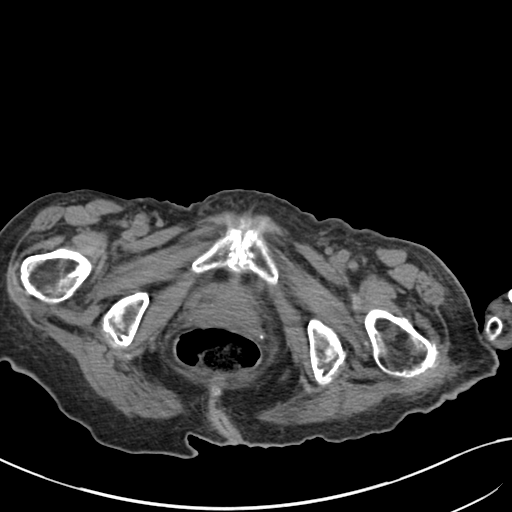
[im 23/89  soft-tissue]
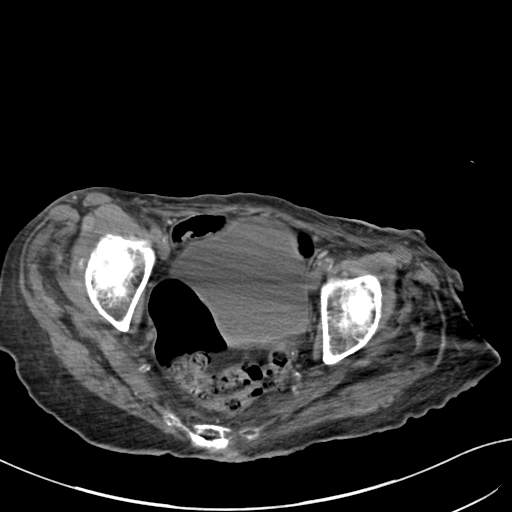
[im 28/89  soft-tissue]
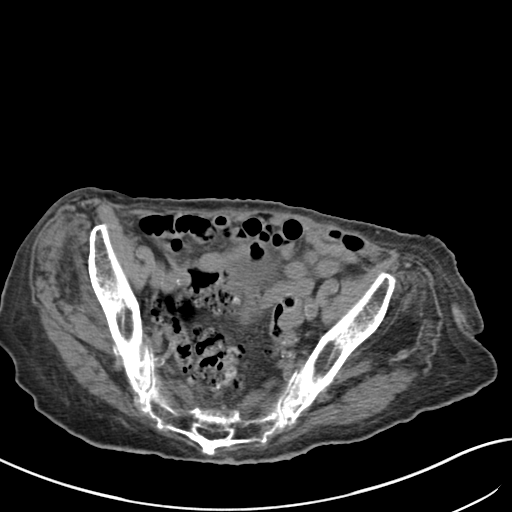
[im 34/89  soft-tissue]
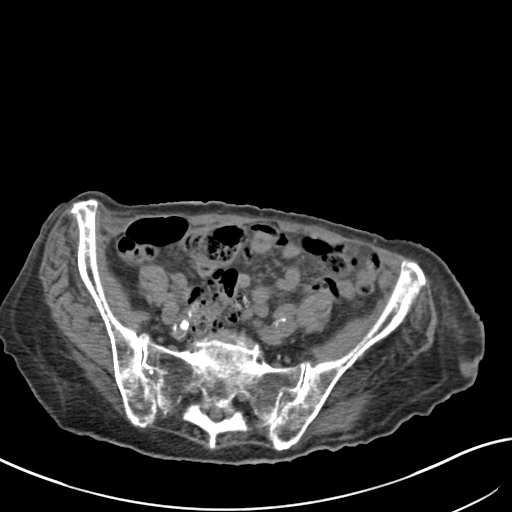
[im 39/89  soft-tissue]
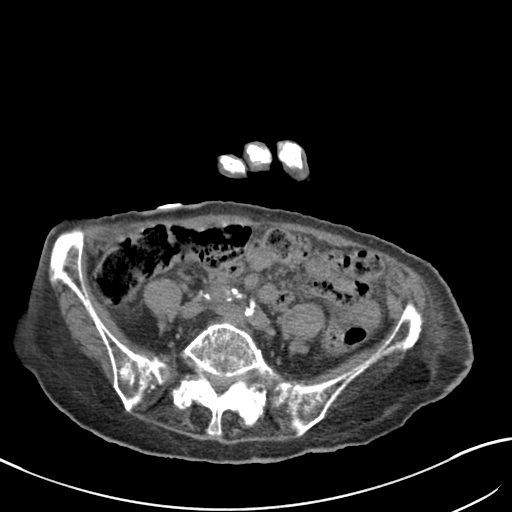
[im 50/89  soft-tissue]
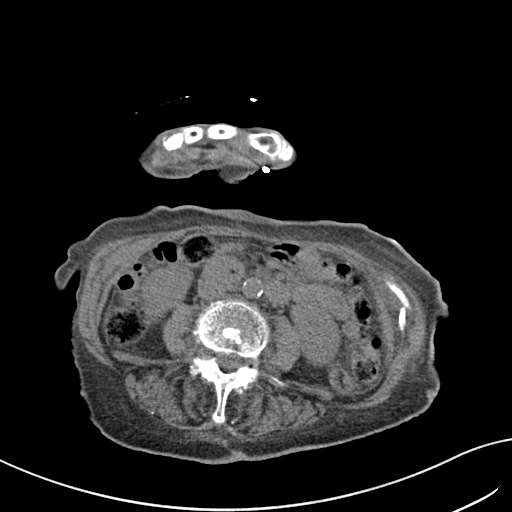
[im 56/89  soft-tissue]
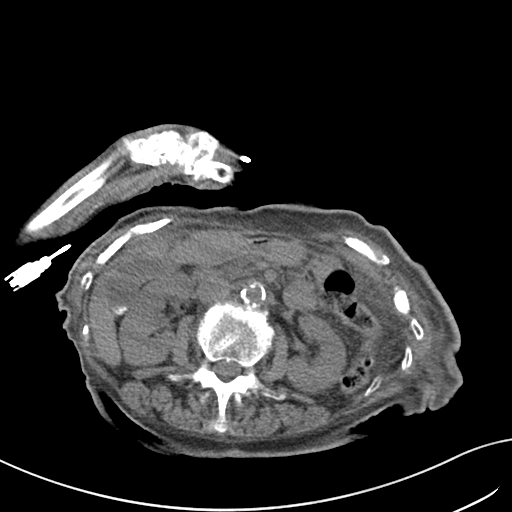
[im 61/89  soft-tissue]
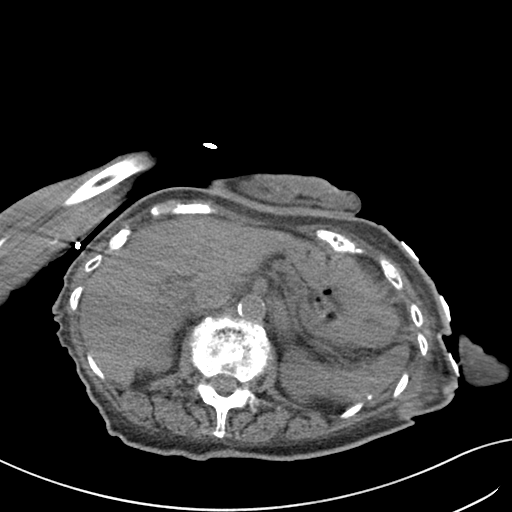
[im 61/89  bone]
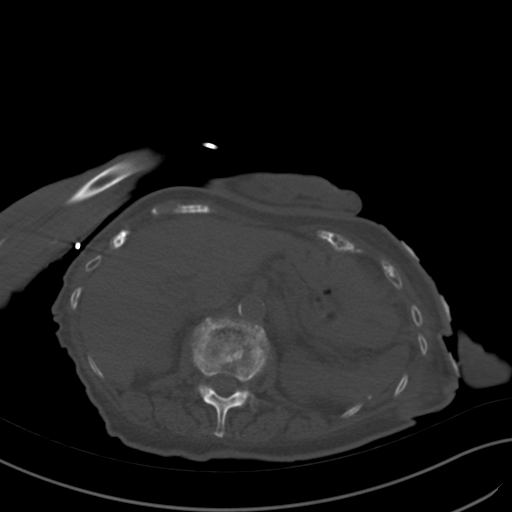
[im 67/89  soft-tissue]
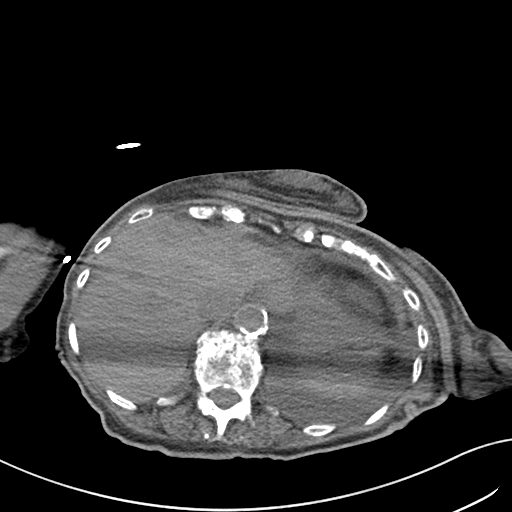
[im 78/89  soft-tissue]
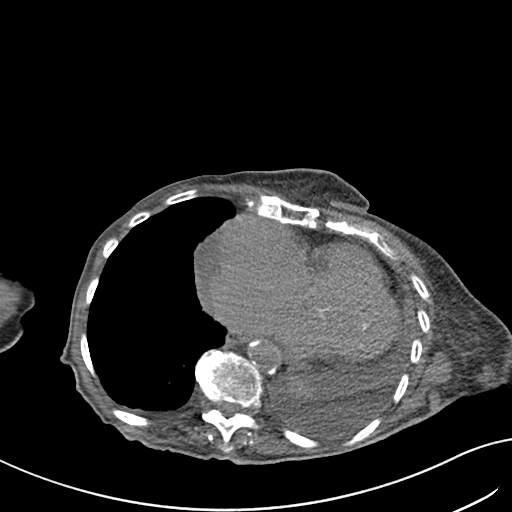
[im 83/89  soft-tissue]
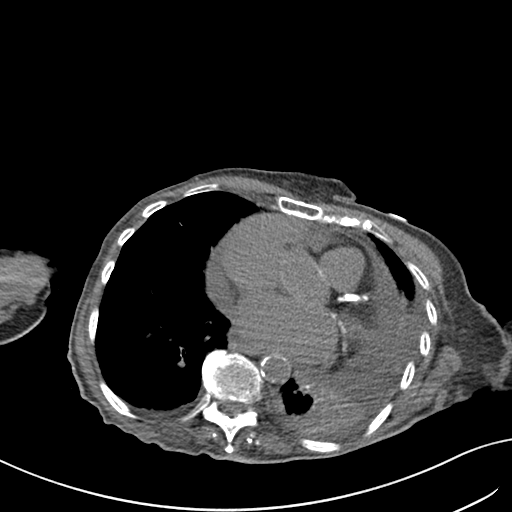

[Series 5: coronal st · coronal · 0.65mm/px · 3 of 76 slices shown]
[im 26/76  soft-tissue]
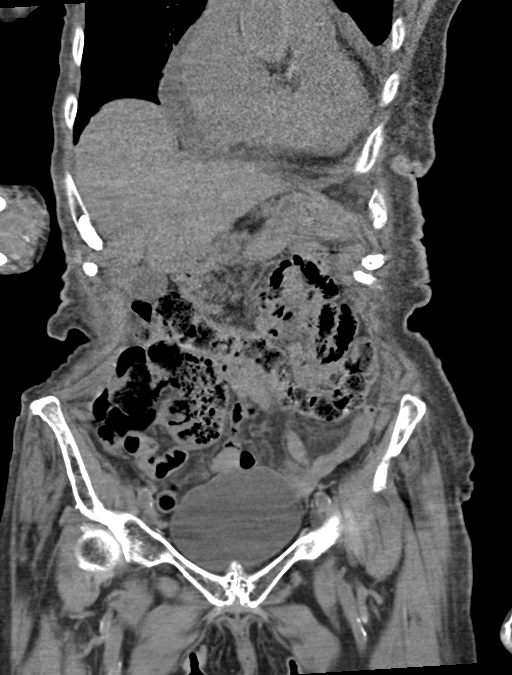
[im 34/76  soft-tissue]
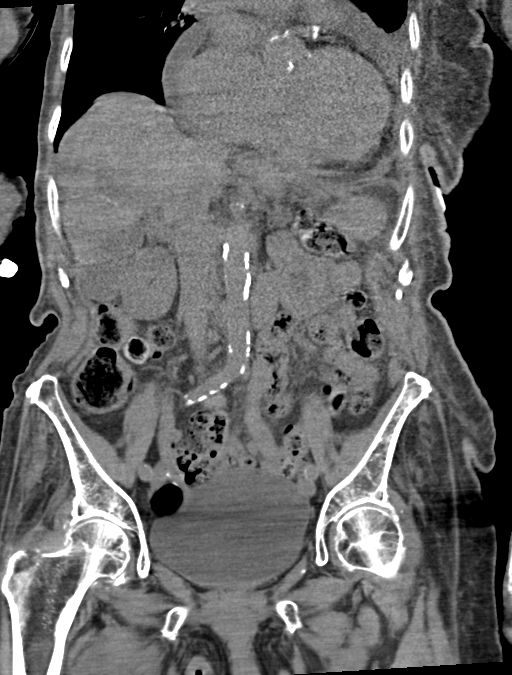
[im 42/76  soft-tissue]
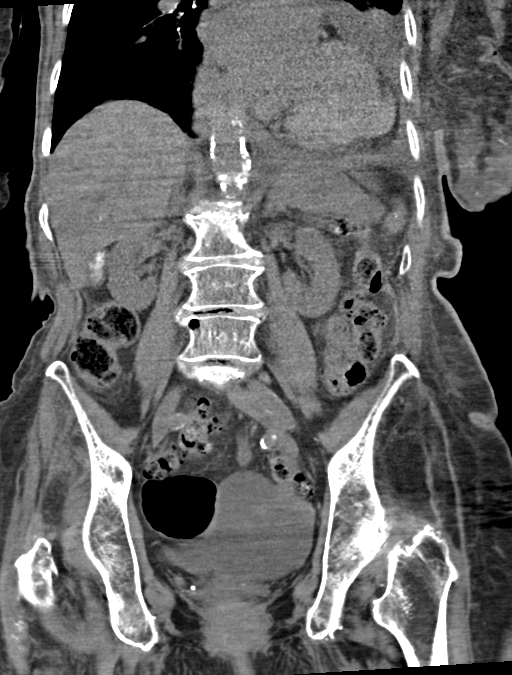

[15 of 46 positions shown; findings below may reference images not displayed]

FINDINGS: Lower chest: Small left and trace right pleural effusion. Left lung
base atelectasis. Enlarged heart. Moderate pericardial effusion.

Hepatobiliary: Normal appearance of the liver. Layering gallbladder
sludge. No evidence of biliary ductal dilation.

Pancreas: Unremarkable. No pancreatic ductal dilatation or
surrounding inflammatory changes.

Spleen: Splenic granulomata.

Adrenals/Urinary Tract: Normal adrenal glands. No evidence of
hydronephrosis or nephrolithiasis. 13 mm right renal cyst. Normal
appearance of the urinary bladder.

Stomach/Bowel: Stomach is within normal limits. Appendix appears
normal. No evidence of bowel wall thickening, distention, or
inflammatory changes. Scattered colonic diverticulosis without
evidence of diverticulitis.

Vascular/Lymphatic: Aortic atherosclerosis. No enlarged abdominal or
pelvic lymph nodes.

Reproductive: Status post hysterectomy. No adnexal masses.

Other: None.

Musculoskeletal: Right superior breast 2.4 cm mass.

Osteopenia. Lumbosacral spine scoliosis. Multilevel moderate to
severe osteoarthritic changes. Likely degenerative 5 mm
anterolisthesis of L4 on L5. No acute fracture seen. Osteoarthritic
changes of bilateral hips and pubic symphysis.
IMPRESSION: No evidence of acute fracture.  Osteopenia.

Advance lumbosacral spine scoliosis with likely degenerative
anterolisthesis of L4 on L5.

2.4 cm right superior breast mass, concerning for breast cancer.
Please correlate to mammography, if found clinically appropriate.

Enlarged heart with moderate pericardial effusion.

Small left and trace right pleural effusions. Mild left lung base
atelectasis.

Cholelithiasis without evidence of acute cholecystitis.

Diverticulosis without evidence of diverticulitis.

These results were called by telephone at the time of interpretation
on 03/18/2018 at [DATE] to Dr. YOEL TIGER , who verbally
acknowledged these results.
